# Patient Record
Sex: Female | Born: 1980 | Race: White | Hispanic: No | Marital: Single | Smoking: Never smoker
Health system: Southern US, Community
[De-identification: ages and names within clinical notes are randomized; demographics above are authoritative.]

## PROBLEM LIST (undated history)

## (undated) DIAGNOSIS — E282 Polycystic ovarian syndrome: Secondary | ICD-10-CM

## (undated) DIAGNOSIS — I10 Essential (primary) hypertension: Secondary | ICD-10-CM

## (undated) HISTORY — PX: SKIN CANCER EXCISION: SHX779

## (undated) HISTORY — DX: Polycystic ovarian syndrome: E28.2

## (undated) HISTORY — DX: Essential (primary) hypertension: I10

---

## 2012-07-07 LAB — HM PAP SMEAR: HM PAP: NEGATIVE

## 2012-11-02 ENCOUNTER — Ambulatory Visit: Payer: Self-pay | Admitting: Family Medicine

## 2013-02-08 ENCOUNTER — Ambulatory Visit: Payer: Self-pay | Admitting: Gastroenterology

## 2014-07-06 DIAGNOSIS — E282 Polycystic ovarian syndrome: Secondary | ICD-10-CM | POA: Insufficient documentation

## 2015-06-09 ENCOUNTER — Encounter: Payer: Self-pay | Admitting: Family Medicine

## 2015-06-09 ENCOUNTER — Ambulatory Visit (INDEPENDENT_AMBULATORY_CARE_PROVIDER_SITE_OTHER): Payer: BC Managed Care – PPO | Admitting: Family Medicine

## 2015-06-09 VITALS — BP 104/84 | HR 66 | Temp 97.9°F | Resp 16 | Ht 66.0 in | Wt 217.2 lb

## 2015-06-09 DIAGNOSIS — L709 Acne, unspecified: Secondary | ICD-10-CM

## 2015-06-09 DIAGNOSIS — K7581 Nonalcoholic steatohepatitis (NASH): Secondary | ICD-10-CM | POA: Insufficient documentation

## 2015-06-09 DIAGNOSIS — J0120 Acute ethmoidal sinusitis, unspecified: Secondary | ICD-10-CM | POA: Diagnosis not present

## 2015-06-09 MED ORDER — AMOXICILLIN-POT CLAVULANATE 875-125 MG PO TABS
1.0000 | ORAL_TABLET | Freq: Two times a day (BID) | ORAL | Status: DC
Start: 2015-06-09 — End: 2016-04-23

## 2015-06-09 NOTE — Patient Instructions (Signed)
Continue decongestants.

## 2015-06-09 NOTE — Progress Notes (Signed)
Subjective:     Patient ID: Elizabeth Hurst, female   DOB: [birth year], 34 y.o.   MRN: [id number]  HPI  Chief Complaint  Patient presents with  . Sinusitis    Patient comes in office today with concerns of sinus pain/pressure and congestion for the past 10 days. Patient reports taking otc Sudafed, Allegra and Nightquil for relief.   Reports purulent congestion.   Review of Systems  Constitutional: Negative for fever and chills.       Objective:   Physical Exam  Constitutional: She appears well-developed and well-nourished. No distress.  Ears: T.M's intact without inflammation Sinuses: + paranasal sinus tenderness Throat: no tonsillar enlargement or exudate Neck: no cervical adenopathy Lungs: clear     Assessment:    1. Acute ethmoidal sinusitis, recurrence not specified - amoxicillin-clavulanate (AUGMENTIN) 875-125 MG tablet; Take 1 tablet by mouth 2 (two) times daily.  Dispense: 20 tablet; Refill: 0    Plan:    Continue decongestants. ENT referral if recurrent.

## 2016-04-23 ENCOUNTER — Ambulatory Visit (INDEPENDENT_AMBULATORY_CARE_PROVIDER_SITE_OTHER): Payer: BC Managed Care – PPO | Admitting: Family Medicine

## 2016-04-23 ENCOUNTER — Ambulatory Visit
Admission: RE | Admit: 2016-04-23 | Discharge: 2016-04-23 | Disposition: A | Discharge status: Home / Self Care | Payer: BC Managed Care – PPO | Source: Ambulatory Visit | Attending: Family Medicine | Admitting: Family Medicine

## 2016-04-23 ENCOUNTER — Telehealth: Payer: Self-pay

## 2016-04-23 ENCOUNTER — Encounter: Payer: Self-pay | Admitting: Family Medicine

## 2016-04-23 VITALS — BP 124/94 | HR 72 | Temp 97.9°F | Resp 16 | Wt 233.6 lb

## 2016-04-23 DIAGNOSIS — X58XXXA Exposure to other specified factors, initial encounter: Secondary | ICD-10-CM | POA: Insufficient documentation

## 2016-04-23 DIAGNOSIS — M79642 Pain in left hand: Secondary | ICD-10-CM | POA: Diagnosis present

## 2016-04-23 DIAGNOSIS — S6992XA Unspecified injury of left wrist, hand and finger(s), initial encounter: Secondary | ICD-10-CM

## 2016-04-23 DIAGNOSIS — M7989 Other specified soft tissue disorders: Secondary | ICD-10-CM | POA: Insufficient documentation

## 2016-04-23 NOTE — Telephone Encounter (Signed)
-----   Message from Carmon Ginsberg, [place] sent at 04/23/2016  9:25 AM EDT ----- No fracture. Continue treatment we discussed in the office.

## 2016-04-23 NOTE — Patient Instructions (Signed)
Continue use of ibuprofen two pills 3-4 x day. Continue icing for 20 minutes three to four times a day. We will call you with the x-ray results.

## 2016-04-23 NOTE — Telephone Encounter (Signed)
Patient has been advised. KW 

## 2016-04-23 NOTE — Progress Notes (Signed)
Subjective:     Patient ID: Elizabeth Hurst, female   DOB: [birth year], 35 y.o.   MRN: [id number]  HPI  Chief Complaint  Patient presents with  . Hand Pain    Patient comes in office today with concerns of pain to her left hand. Yesterday 9/25 patient states she was walking out bathroom when she swung her hand and hit bathroom counter. Patient reports pain when grasping objects or simple tasks such as washing her hands. Patient states that she took Advil last night for pain relief.   Localizes pain and swelling to her left second metacarpal.   Review of Systems     Objective:   Physical Exam  Constitutional: She appears well-developed and well-nourished. No distress.  Musculoskeletal:  Dorsum of left hand with mild swelling and moderate tenderness over her left mid-shaft metacarpal. Can flex/extend her second finger and wrist without pain. Capillary refill intact distally       Assessment:    1. Hand injury, left, initial encounter - DG Hand Complete Left; Future    Plan:    ACE wrap applied. Continue icing and ibuprofen pending x-ray report.

## 2016-08-31 ENCOUNTER — Ambulatory Visit (INDEPENDENT_AMBULATORY_CARE_PROVIDER_SITE_OTHER): Payer: BC Managed Care – PPO | Admitting: Family Medicine

## 2016-08-31 ENCOUNTER — Encounter: Payer: Self-pay | Admitting: Family Medicine

## 2016-08-31 VITALS — BP 128/76 | HR 82 | Temp 98.2°F | Resp 12 | Wt 232.0 lb

## 2016-08-31 DIAGNOSIS — J0100 Acute maxillary sinusitis, unspecified: Secondary | ICD-10-CM | POA: Diagnosis not present

## 2016-08-31 MED ORDER — AMOXICILLIN 875 MG PO TABS: 875.0000 mg | ORAL_TABLET | Freq: Two times a day (BID) | ORAL | 0 refills | Status: DC

## 2016-08-31 NOTE — Progress Notes (Signed)
Patient: Elizabeth Hurst Female    DOB: March 30, 1981   36 y.o.   MRN: [id number] Visit Date: 08/31/2016  Today's Provider: Vernie Murders, PA   Chief Complaint  Patient presents with  . Sinusitis   Subjective:    HPI  Patient states she has had drainage and stuffy nose with PND for the past 2 weeks. For about 1 week now she has had sinus pressure pain around her eyes headache and teeth ache now. She has been taking Allegra daily, decongestant, Flonase last night and Advil. Denies fever, chills, cough, body aches.   Patient Active Problem List   Diagnosis Date Noted  . NASH (nonalcoholic steatohepatitis) 06/09/2015  . Acne 06/09/2015  . Bilateral polycystic ovarian syndrome 07/06/2014   No past surgical history on file. No family history on file.  Allergies  Allergen Reactions  . Tetanus Toxoids Rash and Anaphylaxis    patient states she has a reaction to the tetnaus shot and that she was told before to take benadryl prior to recieving vaccine     Current Outpatient Prescriptions:  Marland Kitchen  Dermatological Products, Misc. (HYLATOPIC PLUS) CREA, , Disp: , Rfl:  .  doxycycline (VIBRAMYCIN) 100 MG capsule, , Disp: , Rfl:  .  drospirenone-ethinyl estradiol (OCELLA) 3-0.03 MG tablet, Take by mouth., Disp: , Rfl:  .  metFORMIN (GLUCOPHAGE-XR) 500 MG 24 hr tablet, TAKE 2 TABLETS (1,000 MG TOTAL) BY MOUTH 2 (TWO) TIMES DAILY, Disp: , Rfl: 3 .  spironolactone (ALDACTONE) 100 MG tablet, Take by mouth., Disp: , Rfl:  .  FLUCELVAX QUADRIVALENT 0.5 ML SUSY, , Disp: , Rfl:   Review of Systems  Constitutional: Negative.   HENT: Positive for congestion, postnasal drip, sinus pain and sinus pressure.   Respiratory: Negative.   Cardiovascular: Negative.   Neurological: Positive for headaches.    Social History  Substance Use Topics  . Smoking status: Never Smoker  . Smokeless tobacco: Never Used  . Alcohol use Not on file   Objective:   BP 128/76   Pulse 82   Temp 98.2 F (36.8  C)   Resp 12   Wt 232 lb (105.2 kg)   SpO2 98%   BMI 37.45 kg/m   Physical Exam  Constitutional: She is oriented to person, place, and time. She appears well-developed and well-nourished. No distress.  HENT:  Head: Normocephalic and atraumatic.  Right Ear: Hearing and external ear normal.  Left Ear: Hearing and external ear normal.  Nose: Nose normal.  Mouth/Throat: Oropharynx is clear and moist.  Tender maxillary sinuses with cloudy transillumination.  Eyes: Conjunctivae and lids are normal. Right eye exhibits no discharge. Left eye exhibits no discharge. No scleral icterus.  Neck: Neck supple.  Cardiovascular: Normal rate and regular rhythm.   Pulmonary/Chest: Effort normal and breath sounds normal. No respiratory distress.  Musculoskeletal: Normal range of motion.  Neurological: She is alert and oriented to person, place, and time.  Skin: Skin is intact. No lesion and no rash noted.  Psychiatric: She has a normal mood and affect. Her speech is normal and behavior is normal. Thought content normal.      Assessment & Plan:     1. Acute maxillary sinusitis, recurrence not specified Nasal congestion and PND with sinus pressure pain over the past 10-14 days. No fever or cough. May continue antihistamine and Flonase. Add antibiotic and use expectorant if needed. Recheck if no better in 1 weeks. - amoxicillin (AMOXIL) 875 MG tablet; Take 1  tablet (875 mg total) by mouth 2 (two) times daily.  Dispense: 20 tablet; Refill: [place], [place]  [facility]

## 2016-08-31 NOTE — Patient Instructions (Signed)

## 2016-12-07 ENCOUNTER — Ambulatory Visit (INDEPENDENT_AMBULATORY_CARE_PROVIDER_SITE_OTHER): Payer: BC Managed Care – PPO | Admitting: Physician Assistant

## 2016-12-07 ENCOUNTER — Encounter: Payer: Self-pay | Admitting: Physician Assistant

## 2016-12-07 DIAGNOSIS — J0100 Acute maxillary sinusitis, unspecified: Secondary | ICD-10-CM | POA: Diagnosis not present

## 2016-12-07 MED ORDER — AMOXICILLIN 875 MG PO TABS: 875.0000 mg | ORAL_TABLET | Freq: Two times a day (BID) | ORAL | 0 refills | Status: DC

## 2016-12-07 NOTE — Patient Instructions (Signed)

## 2016-12-07 NOTE — Progress Notes (Signed)
Patient: Elizabeth Hurst Female    DOB: Sep 13, 1980   36 y.o.   MRN: [id number] Visit Date: 12/07/2016  Today's Provider: Mar Daring, PA-C   Chief Complaint  Patient presents with  . URI   Subjective:    URI   This is a new problem. The current episode started in the past 7 days (about 5 days). The problem has been gradually worsening. There has been no fever. Associated symptoms include congestion, coughing, headaches, a plugged ear sensation, rhinorrhea, sinus pain and sneezing. Pertinent negatives include no abdominal pain, chest pain, ear pain, nausea, sore throat or wheezing. She has tried antihistamine and decongestant for the symptoms. The treatment provided mild relief.      Allergies  Allergen Reactions  . Tetanus Toxoids Rash and Anaphylaxis    patient states she has a reaction to the tetnaus shot and that she was told before to take benadryl prior to recieving vaccine     Current Outpatient Prescriptions:  .  amoxicillin (AMOXIL) 875 MG tablet, Take 1 tablet (875 mg total) by mouth 2 (two) times daily., Disp: 20 tablet, Rfl: 0 .  Dermatological Products, Misc. (HYLATOPIC PLUS) CREA, , Disp: , Rfl:  .  doxycycline (VIBRAMYCIN) 100 MG capsule, , Disp: , Rfl:  .  drospirenone-ethinyl estradiol (OCELLA) 3-0.03 MG tablet, Take by mouth., Disp: , Rfl:  .  fexofenadine (ALLEGRA) 180 MG tablet, Take 180 mg by mouth daily., Disp: , Rfl:  .  FLUCELVAX QUADRIVALENT 0.5 ML SUSY, , Disp: , Rfl:  .  metFORMIN (GLUCOPHAGE-XR) 500 MG 24 hr tablet, TAKE 2 TABLETS (1,000 MG TOTAL) BY MOUTH 2 (TWO) TIMES DAILY, Disp: , Rfl: 3 .  spironolactone (ALDACTONE) 100 MG tablet, Take by mouth., Disp: , Rfl:   Review of Systems  Constitutional: Positive for fatigue. Negative for fever.  HENT: Positive for congestion, postnasal drip, rhinorrhea, sinus pain and sneezing. Negative for ear pain, sore throat and trouble swallowing.   Respiratory: Positive for cough. Negative for chest  tightness, shortness of breath and wheezing.   Cardiovascular: Negative for chest pain, palpitations and leg swelling.  Gastrointestinal: Negative for abdominal pain and nausea.  Neurological: Positive for headaches. Negative for dizziness.    Social History  Substance Use Topics  . Smoking status: Never Smoker  . Smokeless tobacco: Never Used  . Alcohol use Not on file   Objective:   BP 118/74 (BP Location: Right Arm, Patient Position: Sitting, Cuff Size: Normal)   Pulse 88   Temp 97.9 F (36.6 C)   Resp 16   Wt 233 lb (105.7 kg)   SpO2 99%   BMI 37.61 kg/m  Vitals:   12/07/16 0944  BP: 118/74  Pulse: 88  Resp: 16  Temp: 97.9 F (36.6 C)  SpO2: 99%  Weight: 233 lb (105.7 kg)     Physical Exam  Constitutional: She appears well-developed and well-nourished. No distress.  HENT:  Head: Normocephalic and atraumatic.  Right Ear: Hearing, tympanic membrane, external ear and ear canal normal.  Left Ear: Hearing, tympanic membrane, external ear and ear canal normal.  Nose: Right sinus exhibits maxillary sinus tenderness. Right sinus exhibits no frontal sinus tenderness. Left sinus exhibits maxillary sinus tenderness. Left sinus exhibits no frontal sinus tenderness.  Mouth/Throat: Uvula is midline, oropharynx is clear and moist and mucous membranes are normal. No oropharyngeal exudate.  Neck: Normal range of motion. Neck supple. No tracheal deviation present. No thyromegaly present.  Cardiovascular: Normal rate,  regular rhythm and normal heart sounds.  Exam reveals no gallop and no friction rub.   No murmur heard. Pulmonary/Chest: Effort normal and breath sounds normal. No stridor. No respiratory distress. She has no wheezes. She has no rales.  Lymphadenopathy:    She has no cervical adenopathy.  Skin: She is not diaphoretic.  Vitals reviewed.      Assessment & Plan:     1. Acute maxillary sinusitis, recurrence not specified Worsening symptoms that have not responded to  OTC medications. Will give amoxicillin as below. Continue allergy medications. Stay well hydrated and get plenty of rest. Call if no symptom improvement or if symptoms worsen. - amoxicillin (AMOXIL) 875 MG tablet; Take 1 tablet (875 mg total) by mouth 2 (two) times daily.  Dispense: 20 tablet; Refill: 0       Mar Daring, PA-C  [facility]

## 2017-04-11 ENCOUNTER — Other Ambulatory Visit: Payer: Self-pay | Admitting: Obstetrics and Gynecology

## 2017-06-09 ENCOUNTER — Other Ambulatory Visit: Payer: Self-pay

## 2017-06-09 MED ORDER — DROSPIRENONE-ETHINYL ESTRADIOL 3-0.03 MG PO TABS: 1.0000 | ORAL_TABLET | Freq: Every day | ORAL | 1 refills | Status: DC

## 2017-06-09 NOTE — Telephone Encounter (Signed)
Pt aware bc refill eRx'd. 

## 2017-06-11 ENCOUNTER — Telehealth: Payer: Self-pay | Admitting: Obstetrics and Gynecology

## 2017-06-11 ENCOUNTER — Other Ambulatory Visit: Payer: Self-pay

## 2017-06-11 MED ORDER — DROSPIRENONE-ETHINYL ESTRADIOL 3-0.03 MG PO TABS: 1.0000 | ORAL_TABLET | Freq: Every day | ORAL | 0 refills | Status: DC

## 2017-06-11 NOTE — Telephone Encounter (Signed)
1 refill sent to pharm

## 2017-06-11 NOTE — Telephone Encounter (Signed)
Pt needs bc refilled asap. Annual has be scheduled.

## 2017-07-30 ENCOUNTER — Encounter: Payer: Self-pay | Admitting: Obstetrics and Gynecology

## 2017-07-30 ENCOUNTER — Ambulatory Visit (INDEPENDENT_AMBULATORY_CARE_PROVIDER_SITE_OTHER): Payer: BC Managed Care – PPO | Admitting: Obstetrics and Gynecology

## 2017-07-30 VITALS — BP 130/90 | HR 88 | Ht 66.0 in | Wt 236.0 lb

## 2017-07-30 DIAGNOSIS — Z01419 Encounter for gynecological examination (general) (routine) without abnormal findings: Secondary | ICD-10-CM | POA: Diagnosis not present

## 2017-07-30 DIAGNOSIS — Z3041 Encounter for surveillance of contraceptive pills: Secondary | ICD-10-CM

## 2017-07-30 DIAGNOSIS — E282 Polycystic ovarian syndrome: Secondary | ICD-10-CM

## 2017-07-30 MED ORDER — DROSPIRENONE-ETHINYL ESTRADIOL 3-0.03 MG PO TABS: 1.0000 | ORAL_TABLET | Freq: Every day | ORAL | 4 refills | Status: DC

## 2017-07-30 NOTE — Patient Instructions (Signed)
I value your feedback and entrusting us with your care. If you get a [facility] patient survey, I would appreciate you taking the time to let us know about your experience today. Thank you! 

## 2017-07-30 NOTE — Progress Notes (Signed)
PCP:  Carmon Ginsberg, PA   Chief Complaint  Patient presents with  . Gynecologic Exam     HPI:      [name] is a 37 y.o. No obstetric history on file. who LMP was Patient's last menstrual period was 07/07/2017., presents today for her annual examination.  Her menses are Q3 months with cont dosing OCPs, lasting 5 days.  Dysmenorrhea mild, occurring first 1-2 days of flow. She does not have intermenstrual bleeding. She is on OCPs for PCOS.   Sex activity: not sexually active.  Last Pap: February 28, 2015  Results were: no abnormalities /neg HPV DNA  Hx of STDs: none  There is no FH of breast cancer. There is no FH of ovarian cancer. The patient does not do self-breast exams.  Tobacco use: The patient denies current or previous tobacco use. Alcohol use: none No drug use.  Exercise: not active  She does get adequate calcium and Vitamin D in her diet.   Past Medical History:  Diagnosis Date  . Polycystic ovaries     History reviewed. No pertinent surgical history.  Family History  Problem Relation Age of Onset  . Hyperlipidemia Mother   . Hyperlipidemia Father   . Hyperlipidemia Maternal Grandmother   . Hyperlipidemia Maternal Grandfather   . Hyperlipidemia Paternal Grandmother   . Lymphoma Paternal Grandmother   . Hyperlipidemia Paternal Grandfather   . Lung cancer Paternal Grandfather   . Cervical cancer Maternal Aunt   . Stomach cancer Maternal Aunt        great aunt  . Ovarian cancer Cousin        3rd cousin  . Cancer Cousin        3rd cousin    Social History   Socioeconomic History  . Marital status: Single    Spouse name: Not on file  . Number of children: Not on file  . Years of education: Not on file  . Highest education level: Not on file  Social Needs  . Financial resource strain: Not on file  . Food insecurity - worry: Not on file  . Food insecurity - inability: Not on file  . Transportation needs - medical: Not on file  .  Transportation needs - non-medical: Not on file  Occupational History  . Not on file  Tobacco Use  . Smoking status: Never Smoker  . Smokeless tobacco: Never Used  Substance and Sexual Activity  . Alcohol use: Yes    Alcohol/week: 0.0 oz  . Drug use: No  . Sexual activity: No    Birth control/protection: Pill  Other Topics Concern  . Not on file  Social History Narrative  . Not on file    Current Meds  Medication Sig  . doxycycline (VIBRAMYCIN) 100 MG capsule   . drospirenone-ethinyl estradiol (OCELLA) 3-0.03 MG tablet Take 1 tablet by mouth daily. CONTINUOUS DOSING  . fexofenadine (ALLEGRA) 180 MG tablet Take 180 mg by mouth daily.  . metFORMIN (GLUCOPHAGE-XR) 500 MG 24 hr tablet TAKE 2 TABLETS (1,000 MG TOTAL) BY MOUTH 2 (TWO) TIMES DAILY  . spironolactone (ALDACTONE) 100 MG tablet Take by mouth.  . [DISCONTINUED] drospirenone-ethinyl estradiol (OCELLA) 3-0.03 MG tablet Take 1 tablet daily by mouth.     ROS:  Review of Systems  Constitutional: Negative for fatigue, fever and unexpected weight change.  Respiratory: Negative for cough, shortness of breath and wheezing.   Cardiovascular: Negative for chest pain, palpitations and leg swelling.  Gastrointestinal: Negative for  blood in stool, constipation, diarrhea, nausea and vomiting.  Endocrine: Negative for cold intolerance, heat intolerance and polyuria.  Genitourinary: Negative for dyspareunia, dysuria, flank pain, frequency, genital sores, hematuria, menstrual problem, pelvic pain, urgency, vaginal bleeding, vaginal discharge and vaginal pain.  Musculoskeletal: Negative for back pain, joint swelling and myalgias.  Skin: Negative for rash.  Neurological: Negative for dizziness, syncope, light-headedness, numbness and headaches.  Hematological: Negative for adenopathy.  Psychiatric/Behavioral: Negative for agitation, confusion, sleep disturbance and suicidal ideas. The patient is not nervous/anxious.      Objective: BP  130/90   Pulse 88   Ht 5\' 6"  (1.676 m)   Wt 236 lb (107 kg)   LMP 07/07/2017   BMI 38.09 kg/m    Physical Exam  Constitutional: She is oriented to person, place, and time. She appears well-developed and well-nourished.  Genitourinary: Vagina normal and uterus normal. There is no rash or tenderness on the right labia. There is no rash or tenderness on the left labia. No erythema or tenderness in the vagina. No vaginal discharge found. Right adnexum does not display mass and does not display tenderness. Left adnexum does not display mass and does not display tenderness. Cervix does not exhibit motion tenderness or polyp. Uterus is not enlarged or tender.  Neck: Normal range of motion. No thyromegaly present.  Cardiovascular: Normal rate, regular rhythm and normal heart sounds.  No murmur heard. Pulmonary/Chest: Effort normal and breath sounds normal. Right breast exhibits no mass, no nipple discharge, no skin change and no tenderness. Left breast exhibits no mass, no nipple discharge, no skin change and no tenderness.  Abdominal: Soft. There is no tenderness. There is no guarding.  Musculoskeletal: Normal range of motion.  Neurological: She is alert and oriented to person, place, and time. No cranial nerve deficit.  Psychiatric: She has a normal mood and affect. Her behavior is normal.  Vitals reviewed.   Assessment/Plan: Encounter for annual routine gynecological examination  Encounter for surveillance of contraceptive pills - OCP RF - Plan: drospirenone-ethinyl estradiol (OCELLA) 3-0.03 MG tablet  PCOS (polycystic ovarian syndrome) - Controlled with OCPs. - Plan: drospirenone-ethinyl estradiol (OCELLA) 3-0.03 MG tablet  Meds ordered this encounter  Medications  . drospirenone-ethinyl estradiol (OCELLA) 3-0.03 MG tablet    Sig: Take 1 tablet by mouth daily. CONTINUOUS DOSING    Dispense:  3 Package    Refill:  4             GYN counsel adequate intake of calcium and vitamin D,  diet and exercise     F/U  Return in about 1 year (around 07/30/2018).  Damian Hofstra B. Kaytlin Burklow, PA-C 07/30/2017 11:35 AM

## 2017-08-20 ENCOUNTER — Encounter: Payer: Self-pay | Admitting: Family Medicine

## 2017-08-20 ENCOUNTER — Ambulatory Visit: Payer: BC Managed Care – PPO | Admitting: Family Medicine

## 2017-08-20 VITALS — BP 130/90 | HR 107 | Temp 98.2°F | Resp 16 | Wt 233.0 lb

## 2017-08-20 DIAGNOSIS — H6983 Other specified disorders of Eustachian tube, bilateral: Secondary | ICD-10-CM | POA: Diagnosis not present

## 2017-08-20 DIAGNOSIS — R030 Elevated blood-pressure reading, without diagnosis of hypertension: Secondary | ICD-10-CM

## 2017-08-20 DIAGNOSIS — J069 Acute upper respiratory infection, unspecified: Secondary | ICD-10-CM | POA: Diagnosis not present

## 2017-08-20 NOTE — Patient Instructions (Signed)
Eustachian Tube Dysfunction The eustachian tube connects the middle ear to the back of the nose. It regulates air pressure in the middle ear by allowing air to move between the ear and nose. It also helps to drain fluid from the middle ear space. When the eustachian tube does not function properly, air pressure, fluid, or both can build up in the middle ear. Eustachian tube dysfunction can affect one or both ears. What are the causes? This condition happens when the eustachian tube becomes blocked or cannot open normally. This may result from:  Ear infections.  Colds and other upper respiratory infections.  Allergies.  Irritation, such as from cigarette smoke or acid from the stomach coming up into the esophagus (gastroesophageal reflux).  Sudden changes in air pressure, such as from descending in an airplane.  Abnormal growths in the nose or throat, such as nasal polyps, tumors, or enlarged tissue at the back of the throat (adenoids).  What increases the risk? This condition may be more likely to develop in people who smoke and people who are overweight. Eustachian tube dysfunction may also be more likely to develop in children, especially children who have:  Certain birth defects of the mouth, such as cleft palate.  Large tonsils and adenoids.  What are the signs or symptoms? Symptoms of this condition may include:  A feeling of fullness in the ear.  Ear pain.  Clicking or popping noises in the ear.  Ringing in the ear.  Hearing loss.  Loss of balance.  Symptoms may get worse when the air pressure around you changes, such as when you travel to an area of high elevation or fly on an airplane. How is this diagnosed? This condition may be diagnosed based on:  Your symptoms.  A physical exam of your ear, nose, and throat.  Tests, such as those that measure: ? The movement of your eardrum (tympanogram). ? Your hearing (audiometry).  How is this treated? Treatment  depends on the cause and severity of your condition. If your symptoms are mild, you may be able to relieve your symptoms by moving air into ("popping") your ears. If you have symptoms of fluid in your ears, treatment may include:  Decongestants.  Antihistamines.  Nasal sprays or ear drops that contain medicines that reduce swelling (steroids).  In some cases, you may need to have a procedure to drain the fluid in your eardrum (myringotomy). In this procedure, a small tube is placed in the eardrum to:  Drain the fluid.  Restore the air in the middle ear space.  Follow these instructions at home:  Take over-the-counter and prescription medicines only as told by your health care provider.  Use techniques to help pop your ears as recommended by your health care provider. These may include: ? Chewing gum. ? Yawning. ? Frequent, forceful swallowing. ? Closing your mouth, holding your nose closed, and gently blowing as if you are trying to blow air out of your nose.  Do not do any of the following until your health care provider approves: ? Travel to high altitudes. ? Fly in airplanes. ? Work in a pressurized cabin or room. ? Scuba dive.  Keep your ears dry. Dry your ears completely after showering or bathing.  Do not smoke.  Keep all follow-up visits as told by your health care provider. This is important. Contact a health care provider if:  Your symptoms do not go away after treatment.  Your symptoms come back after treatment.  You are   unable to pop your ears.  You have: ? A fever. ? Pain in your ear. ? Pain in your head or neck. ? Fluid draining from your ear.  Your hearing suddenly changes.  You become very dizzy.  You lose your balance. This information is not intended to replace advice given to you by your health care provider. Make sure you discuss any questions you have with your health care provider. Document Released: 08/11/2015 Document Revised: 12/21/2015  Document Reviewed: 08/03/2014 Elsevier Interactive Patient Education  2018 [employer]  

## 2017-08-20 NOTE — Progress Notes (Signed)
Patient: Elizabeth Hurst Female    DOB: [birth year]   37 y.o.   MRN: [id number] Visit Date: 08/20/2017  Today's Provider: [name], MD   I, Martha Clan, CMA, am acting as scribe for [name], MD.  Chief Complaint  Patient presents with  . URI   Subjective:    URI   This is a new problem. Episode onset: x 6 days. The problem has been unchanged. Maximum temperature: highest documented temperature was 99 "point something" Associated symptoms include congestion, coughing (productive of yellow sputum), diarrhea (at onset), headaches, a plugged ear sensation, rhinorrhea, sneezing and swollen glands (at onset). Pertinent negatives include no abdominal pain, chest pain, dysuria, ear pain, nausea, neck pain, sinus pain, sore throat, vomiting or wheezing. Treatments tried: NyQuil, DayQuil, Mucinex. The treatment provided mild relief.   Elevated BP:  - has never been diagnosed with HTN - states it has never been elevated this high in the past - takes no medications for HTN - denies vision changes, chest pain, or shortness of breath    Allergies  Allergen Reactions  . Tetanus Toxoids Rash and Anaphylaxis    patient states she has a reaction to the tetnaus shot and that she was told before to take benadryl prior to recieving vaccine     Current Outpatient Medications:  Marland Kitchen  Dermatological Products, Misc. (HYLATOPIC PLUS) CREA, , Disp: , Rfl:  .  doxycycline (VIBRAMYCIN) 100 MG capsule, , Disp: , Rfl:  .  drospirenone-ethinyl estradiol (OCELLA) 3-0.03 MG tablet, Take 1 tablet by mouth daily. CONTINUOUS DOSING, Disp: 3 Package, Rfl: 4 .  fexofenadine (ALLEGRA) 180 MG tablet, Take 180 mg by mouth daily., Disp: , Rfl:  .  metFORMIN (GLUCOPHAGE-XR) 500 MG 24 hr tablet, TAKE 2 TABLETS (1,000 MG TOTAL) BY MOUTH 2 (TWO) TIMES DAILY, Disp: , Rfl: 3 .  spironolactone (ALDACTONE) 100 MG tablet, Take by mouth., Disp: , Rfl:   Review of Systems  HENT: Positive for  congestion, rhinorrhea and sneezing. Negative for ear pain, sinus pain and sore throat.   Respiratory: Positive for cough (productive of yellow sputum). Negative for wheezing.   Cardiovascular: Negative for chest pain.  Gastrointestinal: Positive for diarrhea (at onset). Negative for abdominal pain, nausea and vomiting.  Genitourinary: Negative for dysuria.  Musculoskeletal: Negative for neck pain.  Neurological: Positive for headaches.    Social History   Tobacco Use  . Smoking status: Never Smoker  . Smokeless tobacco: Never Used  Substance Use Topics  . Alcohol use: Yes    Alcohol/week: 0.0 oz   Objective:   BP 130/90   Pulse (!) 107   Temp 98.2 F (36.8 C) (Oral)   Resp 16   Wt 233 lb (105.7 kg)   LMP 07/20/2017 Comment: continuous dosing  SpO2 99%   BMI 37.61 kg/m  Vitals:   08/20/17 1604 08/20/17 1628  BP: (!) 164/122 130/90  Pulse: (!) 107   Resp: 16   Temp: 98.2 F (36.8 C)   TempSrc: Oral   SpO2: 99%   Weight: 233 lb (105.7 kg)      Physical Exam  Constitutional: She is oriented to person, place, and time. She appears well-developed and well-nourished. No distress.  HENT:  Head: Normocephalic and atraumatic.  Right Ear: External ear and ear canal normal. Tympanic membrane is bulging. Tympanic membrane is not erythematous.  Left Ear: External ear and ear canal normal. Tympanic membrane is bulging. Tympanic membrane is not erythematous.  Nose:  Mucosal edema present. Right sinus exhibits no maxillary sinus tenderness and no frontal sinus tenderness. Left sinus exhibits no maxillary sinus tenderness and no frontal sinus tenderness.  Mouth/Throat: Uvula is midline and mucous membranes are normal. Posterior oropharyngeal erythema present. No oropharyngeal exudate or posterior oropharyngeal edema.  Eyes: Conjunctivae are normal. Pupils are equal, round, and reactive to light. No scleral icterus.  Neck: Neck supple.  Cardiovascular: Normal rate, regular rhythm,  normal heart sounds and intact distal pulses.  No murmur heard. Pulmonary/Chest: Effort normal and breath sounds normal. No respiratory distress. She has no wheezes. She has no rales.  Musculoskeletal: She exhibits no edema or deformity.  Lymphadenopathy:    She has no cervical adenopathy.  Neurological: She is alert and oriented to person, place, and time.  Psychiatric: She has a normal mood and affect. Her behavior is normal.  Vitals reviewed.       Assessment & Plan:     1. Viral URI - no evidence of AOM, CAP, sinusitis - discussed natural course, symptomatic management, and return precautions  2. Dysfunction of both eustachian tubes - discussed flonase use regularly to help with de-congestion and eustachian tube dysfunction  3. Elevated blood pressure reading - better on manual recheck, but still elevated - likely related to acute illness - no red flags - discussed return precautions - f/u in 1 month when well to reeval BP   Return in about 4 weeks (around 09/17/2017) for blood pressure f/u.   The entirety of the information documented in the History of Present Illness, Review of Systems and Physical Exam were personally obtained by me. Portions of this information were initially documented by Raquel Sarna Ratchford, CMA and reviewed by me for thoroughness and accuracy.    Virginia Crews, MD, MPH [facility] 08/20/2017 4:32 PM

## 2017-12-16 ENCOUNTER — Ambulatory Visit: Payer: BC Managed Care – PPO | Admitting: Family Medicine

## 2017-12-16 ENCOUNTER — Encounter: Payer: Self-pay | Admitting: Family Medicine

## 2017-12-16 ENCOUNTER — Telehealth: Payer: Self-pay | Admitting: Family Medicine

## 2017-12-16 ENCOUNTER — Other Ambulatory Visit: Payer: Self-pay | Admitting: Family Medicine

## 2017-12-16 DIAGNOSIS — J302 Other seasonal allergic rhinitis: Secondary | ICD-10-CM | POA: Insufficient documentation

## 2017-12-16 DIAGNOSIS — J301 Allergic rhinitis due to pollen: Secondary | ICD-10-CM | POA: Diagnosis not present

## 2017-12-16 MED ORDER — AMOXICILLIN-POT CLAVULANATE 875-125 MG PO TABS: 1.0000 | ORAL_TABLET | Freq: Two times a day (BID) | ORAL | 0 refills | Status: DC

## 2017-12-16 MED ORDER — PREDNISONE 10 MG PO TABS: ORAL_TABLET | ORAL | 0 refills | Status: DC

## 2017-12-16 NOTE — Telephone Encounter (Signed)
Per Mikki Santee we can add patient to his 3:40 pm appt today 12/16/17 if patient calls back. LMOM for patient. If not able to come she can try Urgent Care or another available appt.

## 2017-12-16 NOTE — Patient Instructions (Signed)
Add a decongestant and continue Flonase. If not improving over the next 24 hours start Prednisone. If you see yellow or green drainage start the antibiotic.

## 2017-12-16 NOTE — Telephone Encounter (Signed)
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 She will call back to schedule appt.

## 2017-12-16 NOTE — Telephone Encounter (Signed)
Pt called wanting to make an appt today with Mikki Santee or anyone for sinus symptoms, congestion and pain in that area,  Questioning a sinus infection.  There are no appts today and I offered her tomorrow am at 8 am but she cant make that.  Please advise  [phone]  Con Memos

## 2017-12-16 NOTE — Progress Notes (Signed)
  Subjective:     Patient ID: Elizabeth Hurst, female   DOB: Dec 27, 1980, 37 y.o.   MRN: [id number] Chief Complaint  Patient presents with  . Sinus Problem   HPI Reports her allergies were flaring last week and resumed Flonase. Over the last 3 days reports increased sinus pressure,  Headache, and tooth sensitivity without sinus drainage.  Review of Systems     Objective:   Physical Exam  Constitutional: She appears well-developed and well-nourished. No distress.  Ears: T.M's intact without inflammation Sinuses: mild maxillary and paranasal sinus tenderness Throat: no tonsillar enlargement or exudate Neck: no cervical adenopathy Lungs: clear     Assessment:    1. Seasonal allergic rhinitis due to pollen - predniSONE (DELTASONE) 10 MG tablet; Taper daily as follows: 6 pills, 5, 4, 3, 2, 1  Dispense: 21 tablet; Refill: 0 - amoxicillin-clavulanate (AUGMENTIN) 875-125 MG tablet; Take 1 tablet by mouth 2 (two) times daily.  Dispense: 20 tablet; Refill: 0    Plan:    Continue Flonase and add decongestants. If sinuses not improving add prednisone. For purulent sinus drainage start the abx.

## 2018-08-17 ENCOUNTER — Other Ambulatory Visit: Payer: Self-pay | Admitting: Obstetrics and Gynecology

## 2018-08-17 DIAGNOSIS — E282 Polycystic ovarian syndrome: Secondary | ICD-10-CM

## 2018-08-17 DIAGNOSIS — Z3041 Encounter for surveillance of contraceptive pills: Secondary | ICD-10-CM

## 2018-09-04 ENCOUNTER — Other Ambulatory Visit: Payer: Self-pay

## 2018-09-04 DIAGNOSIS — Z3041 Encounter for surveillance of contraceptive pills: Secondary | ICD-10-CM

## 2018-09-04 DIAGNOSIS — E282 Polycystic ovarian syndrome: Secondary | ICD-10-CM

## 2018-09-04 MED ORDER — DROSPIRENONE-ETHINYL ESTRADIOL 3-0.03 MG PO TABS: 1.0000 | ORAL_TABLET | Freq: Every day | ORAL | 0 refills | Status: DC

## 2018-09-04 NOTE — Telephone Encounter (Signed)
Pt scheduled appt for 2/18 and needs bc refill until appt.  [phone]  Pt aware refill eRx'd.

## 2018-09-15 ENCOUNTER — Other Ambulatory Visit (HOSPITAL_COMMUNITY)
Admission: RE | Admit: 2018-09-15 | Discharge: 2018-09-15 | Disposition: A | Discharge status: Home / Self Care | Payer: BC Managed Care – PPO | Source: Ambulatory Visit | Attending: Obstetrics and Gynecology | Admitting: Obstetrics and Gynecology

## 2018-09-15 ENCOUNTER — Encounter: Payer: Self-pay | Admitting: Obstetrics and Gynecology

## 2018-09-15 ENCOUNTER — Ambulatory Visit (INDEPENDENT_AMBULATORY_CARE_PROVIDER_SITE_OTHER): Payer: BC Managed Care – PPO | Admitting: Obstetrics and Gynecology

## 2018-09-15 VITALS — BP 130/90 | HR 96 | Ht 66.0 in | Wt 238.0 lb

## 2018-09-15 DIAGNOSIS — Z1151 Encounter for screening for human papillomavirus (HPV): Secondary | ICD-10-CM | POA: Diagnosis present

## 2018-09-15 DIAGNOSIS — Z3041 Encounter for surveillance of contraceptive pills: Secondary | ICD-10-CM

## 2018-09-15 DIAGNOSIS — Z124 Encounter for screening for malignant neoplasm of cervix: Secondary | ICD-10-CM | POA: Diagnosis not present

## 2018-09-15 DIAGNOSIS — Z01419 Encounter for gynecological examination (general) (routine) without abnormal findings: Secondary | ICD-10-CM

## 2018-09-15 DIAGNOSIS — E282 Polycystic ovarian syndrome: Secondary | ICD-10-CM

## 2018-09-15 MED ORDER — DROSPIRENONE-ETHINYL ESTRADIOL 3-0.03 MG PO TABS: 1.0000 | ORAL_TABLET | Freq: Every day | ORAL | 3 refills | Status: DC

## 2018-09-15 NOTE — Progress Notes (Addendum)
PCP:  Carmon Ginsberg, PA   Chief Complaint  Patient presents with  . Gynecologic Exam     HPI:      [name] is a 38 y.o. No obstetric history on file. who LMP was Patient's last menstrual period was 08/31/2018 (approximate)., presents today for her annual examination.  Her menses are Q3 months with cont dosing OCPs, lasting 5-7 days.  Dysmenorrhea mild, occurring first 1-2 days of flow. She does not have intermenstrual bleeding. She is on OCPs for PCOS. Also followed by endocrine and takes metformin and spironolactone for PCOS.   Sex activity: not sexually active.  Last Pap: February 28, 2015  Results were: no abnormalities /neg HPV DNA  Hx of STDs: none  There is no FH of breast cancer. There is no FH of ovarian cancer. The patient does not do self-breast exams.  Tobacco use: The patient denies current or previous tobacco use. Alcohol use: none No drug use.  Exercise: not active  She does get adequate calcium but not Vitamin D in her diet. Labs with PCP  Past Medical History:  Diagnosis Date  . Polycystic ovaries     History reviewed. No pertinent surgical history.  Family History  Problem Relation Age of Onset  . Hyperlipidemia Mother   . Hyperlipidemia Father   . Hyperlipidemia Maternal Grandmother   . Hyperlipidemia Maternal Grandfather   . Hyperlipidemia Paternal Grandmother   . Lymphoma Paternal Grandmother   . Hyperlipidemia Paternal Grandfather   . Lung cancer Paternal Grandfather   . Cervical cancer Maternal Aunt   . Stomach cancer Maternal Aunt        great aunt  . Ovarian cancer Cousin        3rd cousin  . Cancer Cousin        3rd cousin    Social History   Socioeconomic History  . Marital status: Single    Spouse name: Not on file  . Number of children: Not on file  . Years of education: Not on file  . Highest education level: Not on file  Occupational History  . Not on file  Social Needs  . Financial resource strain: Not on  file  . Food insecurity:    Worry: Not on file    Inability: Not on file  . Transportation needs:    Medical: Not on file    Non-medical: Not on file  Tobacco Use  . Smoking status: Never Smoker  . Smokeless tobacco: Never Used  Substance and Sexual Activity  . Alcohol use: Yes    Alcohol/week: 0.0 standard drinks  . Drug use: No  . Sexual activity: Never    Birth control/protection: Pill  Lifestyle  . Physical activity:    Days per week: Not on file    Minutes per session: Not on file  . Stress: Not on file  Relationships  . Social connections:    Talks on phone: Not on file    Gets together: Not on file    Attends religious service: Not on file    Active member of club or organization: Not on file    Attends meetings of clubs or organizations: Not on file    Relationship status: Not on file  . Intimate partner violence:    Fear of current or ex partner: Not on file    Emotionally abused: Not on file    Physically abused: Not on file    Forced sexual activity: Not on file  Other Topics Concern  . Not on file  Social History Narrative  . Not on file    Current Meds  Medication Sig  . Dermatological Products, Misc. (HYLATOPIC PLUS) CREA   . doxycycline (VIBRAMYCIN) 100 MG capsule   . drospirenone-ethinyl estradiol (OCELLA) 3-0.03 MG tablet Take 1 tablet by mouth daily. CONTINUOUS DOSING  . fexofenadine (ALLEGRA) 180 MG tablet Take 180 mg by mouth daily.  . metFORMIN (GLUCOPHAGE-XR) 500 MG 24 hr tablet TAKE 2 TABLETS (1,000 MG TOTAL) BY MOUTH 2 (TWO) TIMES DAILY  . spironolactone (ALDACTONE) 100 MG tablet Take by mouth.  . tretinoin (RETIN-A) 0.05 % cream   . [DISCONTINUED] drospirenone-ethinyl estradiol (OCELLA) 3-0.03 MG tablet Take 1 tablet by mouth daily. CONTINUOUS DOSING     ROS:  Review of Systems  Constitutional: Negative for fatigue, fever and unexpected weight change.  Respiratory: Negative for cough, shortness of breath and wheezing.     Cardiovascular: Negative for chest pain, palpitations and leg swelling.  Gastrointestinal: Negative for blood in stool, constipation, diarrhea, nausea and vomiting.  Endocrine: Negative for cold intolerance, heat intolerance and polyuria.  Genitourinary: Negative for dyspareunia, dysuria, flank pain, frequency, genital sores, hematuria, menstrual problem, pelvic pain, urgency, vaginal bleeding, vaginal discharge and vaginal pain.  Musculoskeletal: Negative for back pain, joint swelling and myalgias.  Skin: Negative for rash.  Neurological: Negative for dizziness, syncope, light-headedness, numbness and headaches.  Hematological: Negative for adenopathy.  Psychiatric/Behavioral: Negative for agitation, confusion, sleep disturbance and suicidal ideas. The patient is not nervous/anxious.      Objective: BP 130/90   Pulse 96   Ht 5\' 6"  (1.676 m)   Wt 238 lb (108 kg)   LMP 08/31/2018 (Approximate)   BMI 38.41 kg/m    Physical Exam Constitutional:      Appearance: She is well-developed.  Genitourinary:     Vagina and uterus normal.     No vaginal discharge, erythema or tenderness.     No cervical motion tenderness or polyp.     Uterus is not enlarged or tender.     No right or left adnexal mass present.     Right adnexa not tender.     Left adnexa not tender.  Neck:     Musculoskeletal: Normal range of motion.     Thyroid: No thyromegaly.  Cardiovascular:     Rate and Rhythm: Normal rate and regular rhythm.     Heart sounds: Normal heart sounds. No murmur.  Pulmonary:     Effort: Pulmonary effort is normal.     Breath sounds: Normal breath sounds.  Chest:     Breasts:        Right: No mass, nipple discharge, skin change or tenderness.        Left: No mass, nipple discharge, skin change or tenderness.  Abdominal:     Palpations: Abdomen is soft.     Tenderness: There is no abdominal tenderness. There is no guarding.  Musculoskeletal: Normal range of motion.  Neurological:      Mental Status: She is alert and oriented to person, place, and time.     Cranial Nerves: No cranial nerve deficit.  Psychiatric:        Behavior: Behavior normal.  Vitals signs reviewed.     Assessment/Plan: Encounter for annual routine gynecological examination  Cervical cancer screening - Plan: Cytology - PAP  Screening for HPV (human papillomavirus) - Plan: Cytology - PAP  Encounter for surveillance of contraceptive pills - OCP RF.  PCOS (polycystic  ovarian syndrome) - Controlled with OCPs. Sees endocrine as well. - Plan: drospirenone-ethinyl estradiol (OCELLA) 3-0.03 MG tablet  Meds ordered this encounter  Medications  . drospirenone-ethinyl estradiol (OCELLA) 3-0.03 MG tablet    Sig: Take 1 tablet by mouth daily. CONTINUOUS DOSING    Dispense:  3 Package    Refill:  3    Order Specific Question:   Supervising Provider    Answer:   Gae Dry [[id number]]             GYN counsel adequate intake of calcium and vitamin D, diet and exercise     F/U  Return in about 1 year (around 09/16/2019).  Sanjna Haskew B. Wilfrido Luedke, PA-C 09/15/2018 4:24 PM

## 2018-09-15 NOTE — Patient Instructions (Signed)
I value your feedback and entrusting us with your care. If you get a Elizabeth Hurst patient survey, I would appreciate you taking the time to let us know about your experience today. Thank you! 

## 2018-09-18 LAB — CYTOLOGY - PAP
Diagnosis: NEGATIVE
HPV: NOT DETECTED

## 2019-02-22 LAB — BASIC METABOLIC PANEL
BUN: 10 (ref 4–21)
Creatinine: 0.7 (ref <=1.1)
Glucose: 99
Potassium: 4 (ref 3.4–5.3)
Sodium: 142 (ref 137–147)

## 2019-02-22 LAB — HEMOGLOBIN A1C: Hemoglobin A1C: 5.3

## 2019-04-21 ENCOUNTER — Encounter: Payer: Self-pay | Admitting: Family Medicine

## 2019-04-21 ENCOUNTER — Other Ambulatory Visit: Payer: Self-pay

## 2019-04-21 ENCOUNTER — Ambulatory Visit (INDEPENDENT_AMBULATORY_CARE_PROVIDER_SITE_OTHER): Payer: BC Managed Care – PPO | Admitting: Family Medicine

## 2019-04-21 VITALS — BP 124/88 | HR 90 | Temp 96.8°F | Resp 16 | Ht 66.0 in | Wt 227.2 lb

## 2019-04-21 DIAGNOSIS — E669 Obesity, unspecified: Secondary | ICD-10-CM | POA: Insufficient documentation

## 2019-04-21 DIAGNOSIS — Z0000 Encounter for general adult medical examination without abnormal findings: Secondary | ICD-10-CM

## 2019-04-21 DIAGNOSIS — K7581 Nonalcoholic steatohepatitis (NASH): Secondary | ICD-10-CM

## 2019-04-21 DIAGNOSIS — Z6836 Body mass index (BMI) 36.0-36.9, adult: Secondary | ICD-10-CM

## 2019-04-21 DIAGNOSIS — Z114 Encounter for screening for human immunodeficiency virus [HIV]: Secondary | ICD-10-CM | POA: Diagnosis not present

## 2019-04-21 DIAGNOSIS — E282 Polycystic ovarian syndrome: Secondary | ICD-10-CM | POA: Diagnosis not present

## 2019-04-21 DIAGNOSIS — L709 Acne, unspecified: Secondary | ICD-10-CM

## 2019-04-21 NOTE — Assessment & Plan Note (Signed)
Recheck LFTs Discussed diet and exercise 

## 2019-04-21 NOTE — Patient Instructions (Signed)
Preventive Care 21-37 Years Old, Female Preventive care refers to visits with your health care provider and lifestyle choices that can promote health and wellness. This includes:  A yearly physical exam. This may also be called an annual well check.  Regular dental visits and eye exams.  Immunizations.  Screening for certain conditions.  Healthy lifestyle choices, such as eating a healthy diet, getting regular exercise, not using drugs or products that contain nicotine and tobacco, and limiting alcohol use. What can I expect for my preventive care visit? Physical exam Your health care provider will check your:  Height and weight. This may be used to calculate body mass index (BMI), which tells if you are at a healthy weight.  Heart rate and blood pressure.  Skin for abnormal spots. Counseling Your health care provider may ask you questions about your:  Alcohol, tobacco, and drug use.  Emotional well-being.  Home and relationship well-being.  Sexual activity.  Eating habits.  Work and work environment.  Method of birth control.  Menstrual cycle.  Pregnancy history. What immunizations do I need?  Influenza (flu) vaccine  This is recommended every year. Tetanus, diphtheria, and pertussis (Tdap) vaccine  You may need a Td booster every 10 years. Varicella (chickenpox) vaccine  You may need this if you have not been vaccinated. Human papillomavirus (HPV) vaccine  If recommended by your health care provider, you may need three doses over 6 months. Measles, mumps, and rubella (MMR) vaccine  You may need at least one dose of MMR. You may also need a second dose. Meningococcal conjugate (MenACWY) vaccine  One dose is recommended if you are age 19-21 years and a first-year college student living in a residence hall, or if you have one of several medical conditions. You may also need additional booster doses. Pneumococcal conjugate (PCV13) vaccine  You may need  this if you have certain conditions and were not previously vaccinated. Pneumococcal polysaccharide (PPSV23) vaccine  You may need one or two doses if you smoke cigarettes or if you have certain conditions. Hepatitis A vaccine  You may need this if you have certain conditions or if you travel or work in places where you may be exposed to hepatitis A. Hepatitis B vaccine  You may need this if you have certain conditions or if you travel or work in places where you may be exposed to hepatitis B. Haemophilus influenzae type b (Hib) vaccine  You may need this if you have certain conditions. You may receive vaccines as individual doses or as more than one vaccine together in one shot (combination vaccines). Talk with your health care provider about the risks and benefits of combination vaccines. What tests do I need?  Blood tests  Lipid and cholesterol levels. These may be checked every 5 years starting at age 20.  Hepatitis C test.  Hepatitis B test. Screening  Diabetes screening. This is done by checking your blood sugar (glucose) after you have not eaten for a while (fasting).  Sexually transmitted disease (STD) testing.  BRCA-related cancer screening. This may be done if you have a family history of breast, ovarian, tubal, or peritoneal cancers.  Pelvic exam and Pap test. This may be done every 3 years starting at age 21. Starting at age 30, this may be done every 5 years if you have a Pap test in combination with an HPV test. Talk with your health care provider about your test results, treatment options, and if necessary, the need for more tests.   Follow these instructions at home: Eating and drinking   Eat a diet that includes fresh fruits and vegetables, whole grains, lean protein, and low-fat dairy.  Take vitamin and mineral supplements as recommended by your health care provider.  Do not drink alcohol if: ? Your health care provider tells you not to drink. ? You are  pregnant, may be pregnant, or are planning to become pregnant.  If you drink alcohol: ? Limit how much you have to 0-1 drink a day. ? Be aware of how much alcohol is in your drink. In the [place] one drink equals one 12 oz bottle of beer (355 mL), one 5 oz glass of wine (148 mL), or one 1 oz glass of hard liquor (44 mL). Lifestyle  Take daily care of your teeth and gums.  Stay active. Exercise for at least 30 minutes on 5 or more days each week.  Do not use any products that contain nicotine or tobacco, such as cigarettes, e-cigarettes, and chewing tobacco. If you need help quitting, ask your health care provider.  If you are sexually active, practice safe sex. Use a condom or other form of birth control (contraception) in order to prevent pregnancy and STIs (sexually transmitted infections). If you plan to become pregnant, see your health care provider for a preconception visit. What's next?  Visit your health care provider once a year for a well check visit.  Ask your health care provider how often you should have your eyes and teeth checked.  Stay up to date on all vaccines. This information is not intended to replace advice given to you by your health care provider. Make sure you discuss any questions you have with your health care provider. Document Released: 09/10/2001 Document Revised: 03/26/2018 Document Reviewed: 03/26/2018 [employer]vier Patient Education  2020 [employer]  

## 2019-04-21 NOTE — Progress Notes (Signed)
Patient: Elizabeth Hurst, Female    DOB: [birth year], 38 y.o.   MRN: [id number] Visit Date: 04/21/2019  Today's Provider: [name], MD   Chief Complaint  Patient presents with  . Annual Exam   Subjective:    Annual physical exam Elizabeth Hurst is a 38 y.o. female who presents today for health maintenance and complete physical. She feels well. She reports she is not actively exercising . She reports she is sleeping well.  ----------------------------------------------------------------- Last pap:09/15/2018   Review of Systems  Constitutional: Negative.   HENT: Negative.   Eyes: Negative.   Respiratory: Negative.   Cardiovascular: Negative.   Gastrointestinal: Negative.   Endocrine: Negative.   Genitourinary: Negative.   Musculoskeletal: Negative.   Skin: Negative.   Allergic/Immunologic: Positive for environmental allergies.  Neurological: Negative.   Hematological: Negative.   Psychiatric/Behavioral: Negative.     Social History She  reports that she has never smoked. She has never used smokeless tobacco. She reports current alcohol use. She reports that she does not use drugs. Social History   Socioeconomic History  . Marital status: Single    Spouse name: Not on file  . Number of children: Not on file  . Years of education: Not on file  . Highest education level: Not on file  Occupational History  . Not on file  Social Needs  . Financial resource strain: Not on file  . Food insecurity    Worry: Not on file    Inability: Not on file  . Transportation needs    Medical: Not on file    Non-medical: Not on file  Tobacco Use  . Smoking status: Never Smoker  . Smokeless tobacco: Never Used  Substance and Sexual Activity  . Alcohol use: Yes    Alcohol/week: 0.0 standard drinks  . Drug use: No  . Sexual activity: Never    Birth control/protection: Pill  Lifestyle  . Physical activity    Days per week: Not on file    Minutes per session: Not  on file  . Stress: Not on file  Relationships  . Social Herbalist on phone: Not on file    Gets together: Not on file    Attends religious service: Not on file    Active member of club or organization: Not on file    Attends meetings of clubs or organizations: Not on file    Relationship status: Not on file  Other Topics Concern  . Not on file  Social History Narrative  . Not on file    Patient Active Problem List   Diagnosis Date Noted  . Allergic rhinitis, seasonal 12/16/2017  . NASH (nonalcoholic steatohepatitis) 06/09/2015  . Acne 06/09/2015  . Bilateral polycystic ovarian syndrome 07/06/2014    History reviewed. No pertinent surgical history.  Family History  Family Status  Relation Name Status  . Mother  (Not Specified)  . Father  (Not Specified)  . MGM  (Not Specified)  . MGF  (Not Specified)  . PGM  (Not Specified)  . PGF  (Not Specified)  . Mat Aunt  (Not Specified)  . Cousin  (Not Specified)  . Cousin  (Not Specified)   Her family history includes Cancer in her cousin; Cervical cancer in her maternal aunt; Hyperlipidemia in her father, maternal grandfather, maternal grandmother, mother, paternal grandfather, and paternal grandmother; Lung cancer in her paternal grandfather; Lymphoma in her paternal grandmother; Ovarian cancer in her cousin; Stomach cancer in her  maternal aunt.     Allergies  Allergen Reactions  . Tetanus Toxoids Rash and Anaphylaxis    patient states she has a reaction to the tetnaus shot and that she was told before to take benadryl prior to recieving vaccine    Previous Medications   DERMATOLOGICAL PRODUCTS, MISC. (HYLATOPIC PLUS) CREA       DOXYCYCLINE (VIBRAMYCIN) 100 MG CAPSULE       DROSPIRENONE-ETHINYL ESTRADIOL (OCELLA) 3-0.03 MG TABLET    Take 1 tablet by mouth daily. CONTINUOUS DOSING   FEXOFENADINE (ALLEGRA) 180 MG TABLET    Take 180 mg by mouth daily.   METFORMIN (GLUCOPHAGE-XR) 500 MG 24 HR TABLET    TAKE 2 TABLETS  (1,000 MG TOTAL) BY MOUTH 2 (TWO) TIMES DAILY   SPIRONOLACTONE (ALDACTONE) 100 MG TABLET    Take by mouth.   TRETINOIN (RETIN-A) 0.05 % CREAM        Patient Care Team: Virginia Crews, MD as PCP - General (Family Medicine)      Objective:   Vitals: BP 124/88   Pulse 90   Temp (!) 96.8 F (36 C) (Oral)   Resp 16   Ht 5\' 6"  (1.676 m)   Wt 227 lb 3.2 oz (103.1 kg)   SpO2 99%   BMI 36.67 kg/m    Physical Exam Vitals signs reviewed.  Constitutional:      General: She is not in acute distress.    Appearance: Normal appearance. She is well-developed. She is not diaphoretic.  HENT:     Head: Normocephalic and atraumatic.     Right Ear: Tympanic membrane, ear canal and external ear normal.     Left Ear: Tympanic membrane, ear canal and external ear normal.  Eyes:     General: No scleral icterus.    Conjunctiva/sclera: Conjunctivae normal.     Pupils: Pupils are equal, round, and reactive to light.  Neck:     Musculoskeletal: Neck supple.     Thyroid: No thyromegaly.  Cardiovascular:     Rate and Rhythm: Normal rate and regular rhythm.     Pulses: Normal pulses.     Heart sounds: Normal heart sounds. No murmur.  Pulmonary:     Effort: Pulmonary effort is normal. No respiratory distress.     Breath sounds: Normal breath sounds. No wheezing or rales.  Abdominal:     General: There is no distension.     Palpations: Abdomen is soft.     Tenderness: There is no abdominal tenderness.  Genitourinary:    Comments: Breasts: breasts appear normal, no suspicious masses, no skin or nipple changes or axillary nodes.  Musculoskeletal:        General: No deformity.     Right lower leg: No edema.     Left lower leg: No edema.  Lymphadenopathy:     Cervical: No cervical adenopathy.  Skin:    General: Skin is warm and dry.     Capillary Refill: Capillary refill takes less than 2 seconds.     Findings: No rash.  Neurological:     Mental Status: She is alert and oriented to  person, place, and time. Mental status is at baseline.  Psychiatric:        Mood and Affect: Mood normal.        Behavior: Behavior normal.        Thought Content: Thought content normal.     Depression Screen PHQ 2/9 Scores 04/21/2019  PHQ - 2 Score 0  PHQ-  9 Score 0    Assessment & Plan:     Routine Health Maintenance and Physical Exam  Exercise Activities and Dietary recommendations Goals   None     Immunization History  Administered Date(s) Administered  . Hepatitis A, Adult 03/10/2013, 09/14/2013  . Influenza Inj Mdck Quad Pf 06/12/2017, 05/20/2018  . Influenza,inj,Quad PF,6+ Mos 06/15/2016  . Tdap 12/02/2005    Health Maintenance  Topic Date Due  . HIV Screening  02/22/1997  . TETANUS/TDAP  12/03/2015  . INFLUENZA VACCINE  02/27/2019  . PAP SMEAR-Modifier  09/16/2023     Discussed health benefits of physical activity, and encouraged her to engage in regular exercise appropriate for her age and condition.    Patient has h/o significant swelling and rash from Tetanus vaccination that got worse after 2nd vaccine.  Recommend getting if needed for cut or bite, etc and taking benadryl with it. --------------------------------------------------------------------  Problem List Items Addressed This Visit      Digestive   NASH (nonalcoholic steatohepatitis)    Recheck LFTs Discussed diet and exercise      Relevant Orders   CBC with Differential/Platelet   Hepatic function panel     Endocrine   Bilateral polycystic ovarian syndrome    Followed by OBgyn Continue spironolactone, metformin, and OCPs Screening for metabolic disorders        Musculoskeletal and Integument   Acne    Continue spironolactone and doxycycline        Other   Morbid obesity ([place])    Discussed importance of healthy weight management Discussed diet and exercise        Other Visit Diagnoses    Encounter for annual physical exam    -  Primary   Screening for HIV (human  immunodeficiency virus)       Relevant Orders   HIV antibody (with reflex)       Return in about 1 year (around 04/20/2020) for CPE.   The entirety of the information documented in the History of Present Illness, Review of Systems and Physical Exam were personally obtained by me. Portions of this information were initially documented by Hebert Soho, CMA and reviewed by me for thoroughness and accuracy.    Atzel Mccambridge, Dionne Bucy, MD MPH [facility]

## 2019-04-21 NOTE — Assessment & Plan Note (Signed)
Continue spironolactone and doxycycline

## 2019-04-21 NOTE — Assessment & Plan Note (Addendum)
Followed by OBgyn Continue spironolactone, metformin, and OCPs Screening for metabolic disorders

## 2019-04-21 NOTE — Assessment & Plan Note (Signed)
Discussed importance of healthy weight management Discussed diet and exercise  

## 2019-04-23 DIAGNOSIS — Z23 Encounter for immunization: Secondary | ICD-10-CM

## 2019-04-29 LAB — CBC WITH DIFFERENTIAL/PLATELET
Basophils Absolute: 0 10*3/uL (ref 0.0–0.2)
Basos: 0 %
EOS (ABSOLUTE): 0.1 10*3/uL (ref 0.0–0.4)
Eos: 1 %
Hematocrit: 44.3 % (ref 34.0–46.6)
Hemoglobin: 15 g/dL (ref 11.1–15.9)
Immature Grans (Abs): 0.1 10*3/uL (ref 0.0–0.1)
Immature Granulocytes: 1 %
Lymphocytes Absolute: 3.5 10*3/uL — ABNORMAL HIGH (ref 0.7–3.1)
Lymphs: 33 %
MCH: 30.2 pg (ref 26.6–33.0)
MCHC: 33.9 g/dL (ref 31.5–35.7)
MCV: 89 fL (ref 79–97)
Monocytes Absolute: 0.6 10*3/uL (ref 0.1–0.9)
Monocytes: 6 %
Neutrophils Absolute: 6.4 10*3/uL (ref 1.4–7.0)
Neutrophils: 59 %
Platelets: 334 10*3/uL (ref 150–450)
RBC: 4.97 x10E6/uL (ref 3.77–5.28)
RDW: 12.5 % (ref 11.7–15.4)
WBC: 10.7 10*3/uL (ref 3.4–10.8)

## 2019-04-29 LAB — LIPID PANEL
Chol/HDL Ratio: 3.2 ratio (ref 0.0–4.4)
Cholesterol, Total: 217 mg/dL — ABNORMAL HIGH (ref 100–199)
HDL: 67 mg/dL (ref >39)
LDL Chol Calc (NIH): 138 mg/dL — ABNORMAL HIGH (ref 0–99)
Triglycerides: 71 mg/dL (ref 0–149)
VLDL Cholesterol Cal: 12 mg/dL (ref 5–40)

## 2019-04-29 LAB — HEPATIC FUNCTION PANEL
ALT: 26 IU/L (ref 0–32)
AST: 23 IU/L (ref 0–40)
Albumin: 4.2 g/dL (ref 3.8–4.8)
Alkaline Phosphatase: 52 IU/L (ref 39–117)
Bilirubin Total: <0.2 mg/dL (ref 0.0–1.2)
Bilirubin, Direct: 0.07 mg/dL (ref 0.00–0.40)
Total Protein: 6.7 g/dL (ref 6.0–8.5)

## 2019-04-29 LAB — TSH: TSH: 1.18 u[IU]/mL (ref 0.450–4.500)

## 2019-04-29 LAB — HIV ANTIBODY (ROUTINE TESTING W REFLEX): HIV Screen 4th Generation wRfx: NONREACTIVE

## 2019-09-20 NOTE — Progress Notes (Signed)
PCP:  Virginia Crews, MD   Chief Complaint  Patient presents with  . Gynecologic Exam    spot on left breast noticed since last friday, feels like a lump under, no pain     HPI:      Elizabeth Hurst is a 39 y.o. No obstetric history on file. who LMP was Patient's last menstrual period was 08/01/2019 (approximate)., presents today for her annual examination.  Her menses are Q3 months with cont dosing OCPs, lasting 5-7 days. Dysmenorrhea mild, occurring first 1-2 days of flow. Had LLQ pain a few days after LMP that resolved on its own. No pain now. She does not have intermenstrual bleeding. She is on OCPs for PCOS. Also followed by endocrine and takes metformin and spironolactone for PCOS.   Sex activity: not sexually active.  Last Pap: 09/15/18  Results were: no abnormalities /neg HPV DNA  Hx of STDs: none  There is no FH of breast cancer. There is no FH of ovarian cancer. The patient does self-breast exams. Noticed a LT breast red area a few days ago with mass in the middle. Erythema area has lessened since first noticed. No drainage. Treated with neosporin. Never had this before.   Tobacco use: The patient denies current or previous tobacco use. Alcohol use: social No drug use.  Exercise: mod active  She does get adequate calcium but not Vitamin D in her diet. Labs with PCP  Past Medical History:  Diagnosis Date  . Polycystic ovaries     History reviewed. No pertinent surgical history.  Family History  Problem Relation Age of Onset  . Hyperlipidemia Mother   . Hyperlipidemia Father   . Hyperlipidemia Maternal Grandmother   . Hyperlipidemia Maternal Grandfather   . Hyperlipidemia Paternal Grandmother   . Lymphoma Paternal Grandmother   . Hyperlipidemia Paternal Grandfather   . Lung cancer Paternal Grandfather   . Ovarian cancer Cousin        3rd cousin    Social History   Socioeconomic History  . Marital status: Single    Spouse name: Not on file  .  Number of children: Not on file  . Years of education: Not on file  . Highest education level: Not on file  Occupational History  . Not on file  Tobacco Use  . Smoking status: Never Smoker  . Smokeless tobacco: Never Used  Substance and Sexual Activity  . Alcohol use: Yes    Alcohol/week: 0.0 standard drinks  . Drug use: No  . Sexual activity: Never    Birth control/protection: Pill  Other Topics Concern  . Not on file  Social History Narrative  . Not on file   Social Determinants of Health   Financial Resource Strain:   . Difficulty of Paying Living Expenses: Not on file  Food Insecurity:   . Worried About Charity fundraiser in the Last Year: Not on file  . Ran Out of Food in the Last Year: Not on file  Transportation Needs:   . Lack of Transportation (Medical): Not on file  . Lack of Transportation (Non-Medical): Not on file  Physical Activity:   . Days of Exercise per Week: Not on file  . Minutes of Exercise per Session: Not on file  Stress:   . Feeling of Stress : Not on file  Social Connections:   . Frequency of Communication with Friends and Family: Not on file  . Frequency of Social Gatherings with Friends and Family: Not  on file  . Attends Religious Services: Not on file  . Active Member of Clubs or Organizations: Not on file  . Attends Archivist Meetings: Not on file  . Marital Status: Not on file  Intimate Partner Violence:   . Fear of Current or Ex-Partner: Not on file  . Emotionally Abused: Not on file  . Physically Abused: Not on file  . Sexually Abused: Not on file    Current Meds  Medication Sig  . Dapsone (ACZONE) 7.5 % GEL apply ON THE SKIN daily  . Dermatological Products, Misc. (HYLATOPIC PLUS) CREA   . doxycycline (VIBRAMYCIN) 100 MG capsule   . drospirenone-ethinyl estradiol (OCELLA) 3-0.03 MG tablet Take 1 tablet by mouth daily. CONTINUOUS DOSING  . fexofenadine (ALLEGRA) 180 MG tablet Take 180 mg by mouth daily.  . metFORMIN  (GLUCOPHAGE-XR) 500 MG 24 hr tablet TAKE 2 TABLETS (1,000 MG TOTAL) BY MOUTH 2 (TWO) TIMES DAILY  . spironolactone (ALDACTONE) 100 MG tablet Take by mouth.  . tretinoin (RETIN-A) 0.05 % cream   . [DISCONTINUED] drospirenone-ethinyl estradiol (OCELLA) 3-0.03 MG tablet Take 1 tablet by mouth daily. CONTINUOUS DOSING     ROS:  Review of Systems  Constitutional: Negative for fatigue, fever and unexpected weight change.  Respiratory: Negative for cough, shortness of breath and wheezing.   Cardiovascular: Negative for chest pain, palpitations and leg swelling.  Gastrointestinal: Negative for blood in stool, constipation, diarrhea, nausea and vomiting.  Endocrine: Negative for cold intolerance, heat intolerance and polyuria.  Genitourinary: Negative for dyspareunia, dysuria, flank pain, frequency, genital sores, hematuria, menstrual problem, pelvic pain, urgency, vaginal bleeding, vaginal discharge and vaginal pain.  Musculoskeletal: Negative for back pain, joint swelling and myalgias.  Skin: Negative for rash.  Neurological: Negative for dizziness, syncope, light-headedness, numbness and headaches.  Hematological: Negative for adenopathy.  Psychiatric/Behavioral: Negative for agitation, confusion, sleep disturbance and suicidal ideas. The patient is not nervous/anxious.   BREAST: tenderness, redness   Objective: BP 140/90   Ht 5\' 6"  (1.676 m)   Wt 220 lb (99.8 kg)   LMP 08/01/2019 (Approximate)   BMI 35.51 kg/m    Physical Exam Constitutional:      Appearance: She is well-developed.  Genitourinary:     Vulva, vagina, uterus, right adnexa and left adnexa normal.     No vulval lesion or tenderness noted.     No vaginal discharge, erythema or tenderness.     No cervical motion tenderness or polyp.     Uterus is not enlarged or tender.     No right or left adnexal mass present.     Right adnexa not tender.     Left adnexa not tender.  Neck:     Thyroid: No thyromegaly.   Cardiovascular:     Rate and Rhythm: Normal rate and regular rhythm.     Heart sounds: Normal heart sounds. No murmur.  Pulmonary:     Effort: Pulmonary effort is normal.     Breath sounds: Normal breath sounds.  Chest:     Breasts:        Right: No mass, nipple discharge, skin change or tenderness.        Left: Skin change present. No mass, nipple discharge or tenderness.    Abdominal:     Palpations: Abdomen is soft.     Tenderness: There is no abdominal tenderness. There is no guarding.  Musculoskeletal:        General: Normal range of motion.     Cervical  back: Normal range of motion.  Neurological:     General: No focal deficit present.     Mental Status: She is alert and oriented to person, place, and time.     Cranial Nerves: No cranial nerve deficit.  Skin:    General: Skin is warm and dry.  Psychiatric:        Mood and Affect: Mood normal.        Behavior: Behavior normal.        Thought Content: Thought content normal.        Judgment: Judgment normal.  Vitals reviewed.     Assessment/Plan: Encounter for annual routine gynecological examination  Encounter for surveillance of contraceptive pills  PCOS (polycystic ovarian syndrome) - Controlled with OCPs. Sees endocrine as well. - Plan: drospirenone-ethinyl estradiol (OCELLA) 3-0.03 MG tablet  Breast lesion--LT breast. Looks like resolving superficial infection. Warm compresses. F/u if sx persist (for 2 more wks) or worsen for further eval.   Meds ordered this encounter  Medications  . drospirenone-ethinyl estradiol (OCELLA) 3-0.03 MG tablet    Sig: Take 1 tablet by mouth daily. CONTINUOUS DOSING    Dispense:  3 Package    Refill:  3    Order Specific Question:   Supervising Provider    Answer:   Gae Dry [id number]             GYN counsel adequate intake of calcium and vitamin D, diet and exercise     F/U  Return in about 1 year (around 09/20/2020).  Alicia B. Copland, PA-C 09/21/2019 4:13  PM

## 2019-09-21 ENCOUNTER — Encounter: Payer: Self-pay | Admitting: Obstetrics and Gynecology

## 2019-09-21 ENCOUNTER — Ambulatory Visit (INDEPENDENT_AMBULATORY_CARE_PROVIDER_SITE_OTHER): Payer: BC Managed Care – PPO | Admitting: Obstetrics and Gynecology

## 2019-09-21 ENCOUNTER — Other Ambulatory Visit: Payer: Self-pay

## 2019-09-21 VITALS — BP 140/90 | Ht 66.0 in | Wt 220.0 lb

## 2019-09-21 DIAGNOSIS — Z3041 Encounter for surveillance of contraceptive pills: Secondary | ICD-10-CM

## 2019-09-21 DIAGNOSIS — Z01411 Encounter for gynecological examination (general) (routine) with abnormal findings: Secondary | ICD-10-CM

## 2019-09-21 DIAGNOSIS — E282 Polycystic ovarian syndrome: Secondary | ICD-10-CM | POA: Diagnosis not present

## 2019-09-21 DIAGNOSIS — N6489 Other specified disorders of breast: Secondary | ICD-10-CM | POA: Diagnosis not present

## 2019-09-21 DIAGNOSIS — N649 Disorder of breast, unspecified: Secondary | ICD-10-CM

## 2019-09-21 DIAGNOSIS — Z01419 Encounter for gynecological examination (general) (routine) without abnormal findings: Secondary | ICD-10-CM

## 2019-09-21 MED ORDER — DROSPIRENONE-ETHINYL ESTRADIOL 3-0.03 MG PO TABS: 1.0000 | ORAL_TABLET | Freq: Every day | ORAL | 3 refills | Status: DC

## 2019-09-21 NOTE — Patient Instructions (Signed)
I value your feedback and entrusting us with your care. If you get a [facility] patient survey, I would appreciate you taking the time to let us know about your experience today. Thank you!  As of July 08, 2019, your lab results will be released to your MyChart immediately, before I even have a chance to see them. Please give me time to review them and contact you if there are any abnormalities. Thank you for your patience.  

## 2020-02-28 ENCOUNTER — Ambulatory Visit: Payer: BC Managed Care – PPO | Admitting: Dermatology

## 2020-02-28 ENCOUNTER — Other Ambulatory Visit: Payer: Self-pay

## 2020-02-28 DIAGNOSIS — L700 Acne vulgaris: Secondary | ICD-10-CM | POA: Diagnosis not present

## 2020-02-28 DIAGNOSIS — E282 Polycystic ovarian syndrome: Secondary | ICD-10-CM

## 2020-02-28 MED ORDER — TRETINOIN 0.05 % EX CREA
TOPICAL_CREAM | Freq: Every evening | CUTANEOUS | 5 refills | Status: AC
Start: 2020-02-28 — End: 2021-02-27

## 2020-02-28 MED ORDER — DAPSONE 7.5 % EX GEL: CUTANEOUS | 5 refills | Status: DC

## 2020-02-28 MED ORDER — DOXYCYCLINE MONOHYDRATE 100 MG PO CAPS: ORAL_CAPSULE | ORAL | 5 refills | Status: DC

## 2020-02-28 NOTE — Patient Instructions (Addendum)
Doxycycline should be taken with food to prevent nausea. Do not lay down for 30 minutes after taking. Be cautious with sun exposure and use good sun protection while on this medication. Pregnant women should not take this medication.   Spironolactone can cause increased urination and cause blood pressure to decrease. Please watch for signs of lightheadedness and be cautious when changing position. It can sometimes cause breast tenderness or an irregular period in premenopausal women. It can also increase potassium. The increase in potassium usually is not a concern unless you are taking other medicines that also increase potassium, so please be sure your doctor knows all of the other medications you are taking. This medication should not be taken  by pregnant women.  Topical retinoid medications like tretinoin/Retin-A, adapalene/Differin, tazarotene/Fabior, and Epiduo/Epiduo Forte can cause dryness and irritation when first started. Only apply a pea-sized amount to the entire affected area. Avoid applying it around the eyes, edges of mouth and creases at the nose. If you experience irritation, use a good moisturizer first and/or apply the medicine less often. If you are doing well with the medicine, you can increase how often you use it until you are applying every night. Be careful with sun protection while using this medication as it can make you sensitive to the sun. This medicine should not be used by pregnant women.   Your medications have been sent to [facility] and will be mailed to you after you call them and confirm your information with them.   [facility] [phone], [place], [place] [address]

## 2020-02-28 NOTE — Progress Notes (Signed)
   Follow-Up Visit   Subjective  Elizabeth Hurst is a 39 y.o. female who presents for the following: Follow-up. Patient here today for 6 month acne follow up. She is using Aczone 7.5% and tretinoin 0.05% topically and taking doxycycline 100mg  and spironolactone 100mg  daily. Patient advises acne is doing "really good".  The following portions of the chart were reviewed this encounter and updated as appropriate:  Tobacco  Allergies  Meds  Problems  Med Hx  Surg Hx  Fam Hx     Review of Systems:  No other skin or systemic complaints except as noted in HPI or Assessment and Plan.  Objective  Well appearing patient in no apparent distress; mood and affect are within normal limits.  A focused examination was performed including face. Relevant physical exam findings are noted in the Assessment and Plan.  Objective  face: 1 small to medium active papule of the right face otherwise clear. Acne persistent on multi drug regimen.   Assessment & Plan  Acne vulgaris - severe - better controlled on 2 oral systemic and 2 topical medications. Also with PCOS which exacerbates acne. face  BP 160/110 Patient advises her BP does run high at dr appointments. Patient declines isotretinoin treatment today after discussion.   Cont doxycycline 100mg  1 PO QD with food Cont spironolactone 100mg  1 PO QD as prescribed by GYN for PCOS Cont Aczone 7.5% QAM Cont tretinoin 0.05% QHS  Ordered Medications: doxycycline (MONODOX) 100 MG capsule tretinoin (RETIN-A) 0.05 % cream Dapsone (ACZONE) 7.5 % GEL  Return in about 6 months (around 08/30/2020) for Acne, TBSE.  Graciella Belton, RMA, am acting as scribe for Sarina Ser, MD . Documentation: I have reviewed the above documentation for accuracy and completeness, and I agree with the above.  Sarina Ser, MD

## 2020-02-29 ENCOUNTER — Encounter: Payer: Self-pay | Admitting: Dermatology

## 2020-04-21 ENCOUNTER — Encounter: Payer: Self-pay | Admitting: Family Medicine

## 2020-04-27 ENCOUNTER — Encounter: Payer: Self-pay | Admitting: Family Medicine

## 2020-04-27 ENCOUNTER — Ambulatory Visit (INDEPENDENT_AMBULATORY_CARE_PROVIDER_SITE_OTHER): Payer: BC Managed Care – PPO | Admitting: Family Medicine

## 2020-04-27 ENCOUNTER — Other Ambulatory Visit: Payer: Self-pay

## 2020-04-27 VITALS — BP 138/98 | HR 96 | Temp 98.5°F | Resp 16 | Wt 228.0 lb

## 2020-04-27 DIAGNOSIS — I10 Essential (primary) hypertension: Secondary | ICD-10-CM | POA: Insufficient documentation

## 2020-04-27 DIAGNOSIS — Z1159 Encounter for screening for other viral diseases: Secondary | ICD-10-CM | POA: Diagnosis not present

## 2020-04-27 DIAGNOSIS — Z23 Encounter for immunization: Secondary | ICD-10-CM | POA: Diagnosis not present

## 2020-04-27 DIAGNOSIS — Z0000 Encounter for general adult medical examination without abnormal findings: Secondary | ICD-10-CM | POA: Diagnosis not present

## 2020-04-27 DIAGNOSIS — K7581 Nonalcoholic steatohepatitis (NASH): Secondary | ICD-10-CM | POA: Diagnosis not present

## 2020-04-27 NOTE — Assessment & Plan Note (Signed)
Recheck LFTs Discussed diet and exercise

## 2020-04-27 NOTE — Progress Notes (Signed)
I,Elizabeth Hurst,acting as a scribe for Elizabeth Paganini, MD.,have documented all relevant documentation on the behalf of Elizabeth Paganini, MD,as directed by  Elizabeth Paganini, MD while in the presence of Elizabeth Paganini, MD.  Complete physical exam   Patient: Elizabeth Hurst   DOB: Nov 06, 1980   39 y.o. Female  MRN: [id number] Visit Date: 04/27/2020  Today's healthcare provider: [name], MD   Chief Complaint  Patient presents with  . Annual Exam   Subjective    Elizabeth Hurst is a 39 y.o. female who presents today for a complete physical exam.  She reports consuming a general diet. The patient does not participate in regular exercise at present. She generally feels fairly well. She reports sleeping fairly well. She does have additional problems to discuss today.  HPI  Last pap: 09/15/2018  Elevated blood pressure: Patient states her blood pressure reading have been elevated during office visits with her Dermatologist and Endocrinologist. Outside readings have averaged 138-162/ 93-112. Recent home blood pressure reading was 128/93.  Past Medical History:  Diagnosis Date  . Polycystic ovaries    History reviewed. No pertinent surgical history. Social History   Socioeconomic History  . Marital status: Single    Spouse name: Not on file  . Number of children: Not on file  . Years of education: Not on file  . Highest education level: Not on file  Occupational History  . Not on file  Tobacco Use  . Smoking status: Never Smoker  . Smokeless tobacco: Never Used  Vaping Use  . Vaping Use: Former  Substance and Sexual Activity  . Alcohol use: Yes    Alcohol/week: 0.0 standard drinks  . Drug use: No  . Sexual activity: Never    Birth control/protection: Pill  Other Topics Concern  . Not on file  Social History Narrative  . Not on file   Social Determinants of Health   Financial Resource Strain:   . Difficulty of Paying Living Expenses: Not on  file  Food Insecurity:   . Worried About Charity fundraiser in the Last Year: Not on file  . Ran Out of Food in the Last Year: Not on file  Transportation Needs:   . Lack of Transportation (Medical): Not on file  . Lack of Transportation (Non-Medical): Not on file  Physical Activity:   . Days of Exercise per Week: Not on file  . Minutes of Exercise per Session: Not on file  Stress:   . Feeling of Stress : Not on file  Social Connections:   . Frequency of Communication with Friends and Family: Not on file  . Frequency of Social Gatherings with Friends and Family: Not on file  . Attends Religious Services: Not on file  . Active Member of Clubs or Organizations: Not on file  . Attends Archivist Meetings: Not on file  . Marital Status: Not on file  Intimate Partner Violence:   . Fear of Current or Ex-Partner: Not on file  . Emotionally Abused: Not on file  . Physically Abused: Not on file  . Sexually Abused: Not on file   Family Status  Relation Name Status  . Mother  Alive  . Father  Alive  . MGM  Alive  . MGF  Deceased  . PGM  Deceased  . PGF  Deceased  . Cousin Maternal Deceased   Family History  Problem Relation Age of Onset  . Hyperlipidemia Mother   . Hyperlipidemia Father   .  Hyperlipidemia Maternal Grandmother   . Hyperlipidemia Maternal Grandfather   . Hyperlipidemia Paternal Grandmother   . Lymphoma Paternal Grandmother   . Hyperlipidemia Paternal Grandfather   . Lung cancer Paternal Grandfather   . Ovarian cancer Cousin        3rd cousin   Allergies  Allergen Reactions  . Tetanus Toxoids Rash    patient states she has a large local swelling and rash reaction to the tetnaus shot and that she was told before to take benadryl prior to recieving vaccine    Patient Care Team: Virginia Crews, MD as PCP - General (Family Medicine)   Medications: Outpatient Medications Prior to Visit  Medication Sig  . Dapsone (ACZONE) 7.5 % GEL apply ON THE  SKIN daily  . Dapsone (ACZONE) 7.5 % GEL Apply topically in the morning to face  . Dermatological Products, Misc. (HYLATOPIC PLUS) CREA   . doxycycline (MONODOX) 100 MG capsule Take 1 by mouth once daily with food  . doxycycline (VIBRAMYCIN) 100 MG capsule   . drospirenone-ethinyl estradiol (OCELLA) 3-0.03 MG tablet Take 1 tablet by mouth daily. CONTINUOUS DOSING  . fexofenadine (ALLEGRA) 180 MG tablet Take 180 mg by mouth daily.  . metFORMIN (GLUCOPHAGE-XR) 500 MG 24 hr tablet TAKE 2 TABLETS (1,000 MG TOTAL) BY MOUTH 2 (TWO) TIMES DAILY  . spironolactone (ALDACTONE) 100 MG tablet Take by mouth.  . tretinoin (RETIN-A) 0.05 % cream   . tretinoin (RETIN-A) 0.05 % cream Apply topically at bedtime.   No facility-administered medications prior to visit.    Review of Systems  Constitutional: Negative for chills, fatigue and fever.  HENT: Negative for congestion, ear pain, rhinorrhea, sneezing and sore throat.   Eyes: Negative.  Negative for pain and redness.  Respiratory: Negative for cough, shortness of breath and wheezing.   Cardiovascular: Negative for chest pain and leg swelling.  Gastrointestinal: Negative for abdominal pain, blood in stool, constipation, diarrhea and nausea.  Endocrine: Negative for polydipsia and polyphagia.  Genitourinary: Negative.  Negative for dysuria, flank pain, hematuria, pelvic pain, vaginal bleeding and vaginal discharge.  Musculoskeletal: Negative for arthralgias, back pain, gait problem and joint swelling.  Skin: Negative for rash.  Allergic/Immunologic: Positive for environmental allergies.  Neurological: Negative.  Negative for dizziness, tremors, seizures, weakness, light-headedness, numbness and headaches.  Hematological: Negative for adenopathy.  Psychiatric/Behavioral: Negative.  Negative for behavioral problems, confusion and dysphoric mood. The patient is not nervous/anxious and is not hyperactive.         Objective    BP (!) 138/98 (BP  Location: Left Arm, Patient Position: Sitting, Cuff Size: Large)   Pulse 96   Temp 98.5 F (36.9 C) (Oral)   Resp 16   Wt 228 lb (103.4 kg)   SpO2 97% Comment: room air  BMI 36.80 kg/m  BP Readings from Last 3 Encounters:  04/27/20 (!) 138/98  09/21/19 140/90  04/21/19 124/88      Physical Exam Vitals reviewed.  Constitutional:      General: She is not in acute distress.    Appearance: Normal appearance. She is well-developed. She is not diaphoretic.  HENT:     Head: Normocephalic and atraumatic.     Right Ear: Tympanic membrane, ear canal and external ear normal.     Left Ear: Tympanic membrane, ear canal and external ear normal.  Eyes:     General: No scleral icterus.    Conjunctiva/sclera: Conjunctivae normal.     Pupils: Pupils are equal, round, and reactive to light.  Neck:     Thyroid: No thyromegaly.  Cardiovascular:     Rate and Rhythm: Normal rate and regular rhythm.     Pulses: Normal pulses.     Heart sounds: Normal heart sounds. No murmur heard.   Pulmonary:     Effort: Pulmonary effort is normal. No respiratory distress.     Breath sounds: Normal breath sounds. No wheezing or rales.  Abdominal:     General: There is no distension.     Palpations: Abdomen is soft.     Tenderness: There is no abdominal tenderness.  Musculoskeletal:        General: No deformity.     Cervical back: Neck supple.     Right lower leg: No edema.     Left lower leg: No edema.  Lymphadenopathy:     Cervical: No cervical adenopathy.  Skin:    General: Skin is warm and dry.     Findings: No rash.  Neurological:     Mental Status: She is alert and oriented to person, place, and time. Mental status is at baseline.     Gait: Gait normal.  Psychiatric:        Mood and Affect: Mood normal.        Behavior: Behavior normal.        Thought Content: Thought content normal.       Last depression screening scores PHQ 2/9 Scores 04/27/2020 04/21/2019  PHQ - 2 Score 0 0  PHQ- 9  Score 0 0   Last fall risk screening Fall Risk  04/27/2020  Falls in the past year? 0  Number falls in past yr: 0  Injury with Fall? 0  Follow up Falls evaluation completed   Last Audit-C alcohol use screening Alcohol Use Disorder Test (AUDIT) 04/27/2020  1. How often do you have a drink containing alcohol? 3  2. How many drinks containing alcohol do you have on a typical day when you are drinking? 0  3. How often do you have six or more drinks on one occasion? 0  AUDIT-C Score 3  4. How often during the last year have you found that you were not able to stop drinking once you had started? 0  5. How often during the last year have you failed to do what was normally expected from you because of drinking? 0  6. How often during the last year have you needed a first drink in the morning to get yourself going after a heavy drinking session? 0  7. How often during the last year have you had a feeling of guilt of remorse after drinking? 0  8. How often during the last year have you been unable to remember what happened the night before because you had been drinking? 0  9. Have you or someone else been injured as a result of your drinking? 0  10. Has a relative or friend or a doctor or another health worker been concerned about your drinking or suggested you cut down? 0  Alcohol Use Disorder Identification Test Final Score (AUDIT) 3  Alcohol Brief Interventions/Follow-up AUDIT Score <7 follow-up not indicated   A score of 3 or more in women, and 4 or more in men indicates increased risk for alcohol abuse, EXCEPT if all of the points are from question 1   No results found for any visits on 04/27/20.  Assessment & Plan    Routine Health Maintenance and Physical Exam  Exercise Activities and Dietary recommendations Goals  None     Immunization History  Administered Date(s) Administered  . Hepatitis A, Adult 03/10/2013, 09/14/2013  . Influenza Inj Mdck Quad Pf 06/12/2017, 05/20/2018  .  Influenza,inj,Quad PF,6+ Mos 06/15/2016, 04/23/2019, 04/27/2020  . Moderna SARS-COVID-2 Vaccination 09/23/2019, 10/21/2019  . Tdap 12/02/2005    Health Maintenance  Topic Date Due  . Hepatitis C Screening  Never done  . PAP SMEAR-Modifier  09/16/2023  . INFLUENZA VACCINE  Completed  . COVID-19 Vaccine  Completed  . HIV Screening  Completed    Discussed health benefits of physical activity, and encouraged her to engage in regular exercise appropriate for her age and condition.  Problem List Items Addressed This Visit      Cardiovascular and Mediastinum   Hypertension    New diagnosis Multiple elevated blood pressure readings at home and at other doctors offices Will give 3 months for lifestyle interventions before considering medication Patient is asymptomatic with no red flags Discussed low-sodium diet and exercise Follow-up in 3 months Recheck metabolic panel        Digestive   NASH (nonalcoholic steatohepatitis)    Recheck LFTs Discussed diet and exercise      Relevant Orders   Comprehensive metabolic panel     Other   Morbid obesity ([place])    BMI of 36 and associated with hypertension and Karlene Lineman Discussed importance of healthy weight management Discussed diet and exercise       Relevant Orders   Comprehensive metabolic panel   Lipid panel    Other Visit Diagnoses    Encounter for annual physical exam    -  Primary   Relevant Orders   Hepatitis C Antibody   Comprehensive metabolic panel   Lipid panel   Need for hepatitis C screening test       Relevant Orders   Hepatitis C Antibody   Need for influenza vaccination       Relevant Orders   Flu Vaccine QUAD 36+ mos IM (Fluarix/Fluzone) (Completed)       Return in about 3 months (around 07/27/2020) for BP f/u.     I, Elizabeth Paganini, MD, have reviewed all documentation for this visit. The documentation on 04/27/20 for the exam, diagnosis, procedures, and orders are all accurate and  complete.   Cassius Cullinane, Dionne Bucy, MD, MPH [facility]

## 2020-04-27 NOTE — Patient Instructions (Addendum)
Preventive Care 21-39 Years Old, Female Preventive care refers to visits with your health care provider and lifestyle choices that can promote health and wellness. This includes:  A yearly physical exam. This may also be called an annual well check.  Regular dental visits and eye exams.  Immunizations.  Screening for certain conditions.  Healthy lifestyle choices, such as eating a healthy diet, getting regular exercise, not using drugs or products that contain nicotine and tobacco, and limiting alcohol use. What can I expect for my preventive care visit? Physical exam Your health care provider will check your:  Height and weight. This may be used to calculate body mass index (BMI), which tells if you are at a healthy weight.  Heart rate and blood pressure.  Skin for abnormal spots. Counseling Your health care provider may ask you questions about your:  Alcohol, tobacco, and drug use.  Emotional well-being.  Home and relationship well-being.  Sexual activity.  Eating habits.  Work and work environment.  Method of birth control.  Menstrual cycle.  Pregnancy history. What immunizations do I need?  Influenza (flu) vaccine  This is recommended every year. Tetanus, diphtheria, and pertussis (Tdap) vaccine  You may need a Td booster every 10 years. Varicella (chickenpox) vaccine  You may need this if you have not been vaccinated. Human papillomavirus (HPV) vaccine  If recommended by your health care provider, you may need three doses over 6 months. Measles, mumps, and rubella (MMR) vaccine  You may need at least one dose of MMR. You may also need a second dose. Meningococcal conjugate (MenACWY) vaccine  One dose is recommended if you are age 19-21 years and a first-year college student living in a residence hall, or if you have one of several medical conditions. You may also need additional booster doses. Pneumococcal conjugate (PCV13) vaccine  You may need  this if you have certain conditions and were not previously vaccinated. Pneumococcal polysaccharide (PPSV23) vaccine  You may need one or two doses if you smoke cigarettes or if you have certain conditions. Hepatitis A vaccine  You may need this if you have certain conditions or if you travel or work in places where you may be exposed to hepatitis A. Hepatitis B vaccine  You may need this if you have certain conditions or if you travel or work in places where you may be exposed to hepatitis B. Haemophilus influenzae type b (Hib) vaccine  You may need this if you have certain conditions. You may receive vaccines as individual doses or as more than one vaccine together in one shot (combination vaccines). Talk with your health care provider about the risks and benefits of combination vaccines. What tests do I need?  Blood tests  Lipid and cholesterol levels. These may be checked every 5 years starting at age 20.  Hepatitis C test.  Hepatitis B test. Screening  Diabetes screening. This is done by checking your blood sugar (glucose) after you have not eaten for a while (fasting).  Sexually transmitted disease (STD) testing.  BRCA-related cancer screening. This may be done if you have a family history of breast, ovarian, tubal, or peritoneal cancers.  Pelvic exam and Pap test. This may be done every 3 years starting at age 21. Starting at age 30, this may be done every 5 years if you have a Pap test in combination with an HPV test. Talk with your health care provider about your test results, treatment options, and if necessary, the need for more tests.   Follow these instructions at home: Eating and drinking   Eat a diet that includes fresh fruits and vegetables, whole grains, lean protein, and low-fat dairy.  Take vitamin and mineral supplements as recommended by your health care provider.  Do not drink alcohol if: ? Your health care provider tells you not to drink. ? You are  pregnant, may be pregnant, or are planning to become pregnant.  If you drink alcohol: ? Limit how much you have to 0-1 drink a day. ? Be aware of how much alcohol is in your drink. In the [place] one drink equals one 12 oz bottle of beer (355 mL), one 5 oz glass of wine (148 mL), or one 1 oz glass of hard liquor (44 mL). Lifestyle  Take daily care of your teeth and gums.  Stay active. Exercise for at least 30 minutes on 5 or more days each week.  Do not use any products that contain nicotine or tobacco, such as cigarettes, e-cigarettes, and chewing tobacco. If you need help quitting, ask your health care provider.  If you are sexually active, practice safe sex. Use a condom or other form of birth control (contraception) in order to prevent pregnancy and STIs (sexually transmitted infections). If you plan to become pregnant, see your health care provider for a preconception visit. What's next?  Visit your health care provider once a year for a well check visit.  Ask your health care provider how often you should have your eyes and teeth checked.  Stay up to date on all vaccines. This information is not intended to replace advice given to you by your health care provider. Make sure you discuss any questions you have with your health care provider. Document Revised: 03/26/2018 Document Reviewed: 03/26/2018 Elsevier Patient Education  [place].     Low-Sodium Eating Plan Sodium, which is an element that makes up salt, helps you maintain a healthy balance of fluids in your body. Too much sodium can increase your blood pressure and cause fluid and waste to be held in your body. Your health care provider or dietitian may recommend following this plan if you have high blood pressure (hypertension), kidney disease, liver disease, or heart failure. Eating less sodium can help lower your blood pressure, reduce swelling, and protect your heart, liver, and kidneys. What are tips for  following this plan? General guidelines  Most people on this plan should limit their sodium intake to 1,500-2,000 mg (milligrams) of sodium each day. Reading food labels   The Nutrition Facts label lists the amount of sodium in one serving of the food. If you eat more than one serving, you must multiply the listed amount of sodium by the number of servings.  Choose foods with less than 140 mg of sodium per serving.  Avoid foods with 300 mg of sodium or more per serving. Shopping  Look for lower-sodium products, often labeled as "low-sodium" or "no salt added."  Always check the sodium content even if foods are labeled as "unsalted" or "no salt added".  Buy fresh foods. ? Avoid canned foods and premade or frozen meals. ? Avoid canned, cured, or processed meats  Buy breads that have less than 80 mg of sodium per slice. Cooking  Eat more home-cooked food and less restaurant, buffet, and fast food.  Avoid adding salt when cooking. Use salt-free seasonings or herbs instead of table salt or sea salt. Check with your health care provider or pharmacist before using salt substitutes.  Cook with  plant-based oils, such as canola, sunflower, or olive oil. Meal planning  When eating at a restaurant, ask that your food be prepared with less salt or no salt, if possible.  Avoid foods that contain MSG (monosodium glutamate). MSG is sometimes added to [place] food, bouillon, and some canned foods. What foods are recommended? The items listed may not be a complete list. Talk with your dietitian about what dietary choices are best for you. Grains Low-sodium cereals, including oats, puffed wheat and rice, and shredded wheat. Low-sodium crackers. Unsalted rice. Unsalted pasta. Low-sodium bread. Whole-grain breads and whole-grain pasta. Vegetables Fresh or frozen vegetables. "No salt added" canned vegetables. "No salt added" tomato sauce and paste. Low-sodium or reduced-sodium tomato and vegetable  juice. Fruits Fresh, frozen, or canned fruit. Fruit juice. Meats and other protein foods Fresh or frozen (no salt added) meat, poultry, seafood, and fish. Low-sodium canned tuna and salmon. Unsalted nuts. Dried peas, beans, and lentils without added salt. Unsalted canned beans. Eggs. Unsalted nut butters. Dairy Milk. Soy milk. Cheese that is naturally low in sodium, such as ricotta cheese, fresh mozzarella, or Swiss cheese Low-sodium or reduced-sodium cheese. Cream cheese. Yogurt. Fats and oils Unsalted butter. Unsalted margarine with no trans fat. Vegetable oils such as canola or olive oils. Seasonings and other foods Fresh and dried herbs and spices. Salt-free seasonings. Low-sodium mustard and ketchup. Sodium-free salad dressing. Sodium-free light mayonnaise. Fresh or refrigerated horseradish. Lemon juice. Vinegar. Homemade, reduced-sodium, or low-sodium soups. Unsalted popcorn and pretzels. Low-salt or salt-free chips. What foods are not recommended? The items listed may not be a complete list. Talk with your dietitian about what dietary choices are best for you. Grains Instant hot cereals. Bread stuffing, pancake, and biscuit mixes. Croutons. Seasoned rice or pasta mixes. Noodle soup cups. Boxed or frozen macaroni and cheese. Regular salted crackers. Self-rising flour. Vegetables Sauerkraut, pickled vegetables, and relishes. Olives. [place] fries. Onion rings. Regular canned vegetables (not low-sodium or reduced-sodium). Regular canned tomato sauce and paste (not low-sodium or reduced-sodium). Regular tomato and vegetable juice (not low-sodium or reduced-sodium). Frozen vegetables in sauces. Meats and other protein foods Meat or fish that is salted, canned, smoked, spiced, or pickled. Bacon, ham, sausage, hotdogs, corned beef, chipped beef, packaged lunch meats, salt pork, jerky, pickled herring, anchovies, regular canned tuna, sardines, salted nuts. Dairy Processed cheese and cheese spreads.  Cheese curds. Blue cheese. Feta cheese. String cheese. Regular cottage cheese. Buttermilk. Canned milk. Fats and oils Salted butter. Regular margarine. Ghee. Bacon fat. Seasonings and other foods Onion salt, garlic salt, seasoned salt, table salt, and sea salt. Canned and packaged gravies. Worcestershire sauce. Tartar sauce. Barbecue sauce. Teriyaki sauce. Soy sauce, including reduced-sodium. Steak sauce. Fish sauce. Oyster sauce. Cocktail sauce. Horseradish that you find on the shelf. Regular ketchup and mustard. Meat flavorings and tenderizers. Bouillon cubes. Hot sauce and Tabasco sauce. Premade or packaged marinades. Premade or packaged taco seasonings. Relishes. Regular salad dressings. Salsa. Potato and tortilla chips. Corn chips and puffs. Salted popcorn and pretzels. Canned or dried soups. Pizza. Frozen entrees and pot pies. Summary  Eating less sodium can help lower your blood pressure, reduce swelling, and protect your heart, liver, and kidneys.  Most people on this plan should limit their sodium intake to 1,500-2,000 mg (milligrams) of sodium each day.  Canned, boxed, and frozen foods are high in sodium. Restaurant foods, fast foods, and pizza are also very high in sodium. You also get sodium by adding salt to food.  Try to cook at home,  eat more fresh fruits and vegetables, and eat less fast food, canned, processed, or prepared foods. This information is not intended to replace advice given to you by your health care provider. Make sure you discuss any questions you have with your health care provider. Document Revised: 06/27/2017 Document Reviewed: 07/08/2016 Elsevier Patient Education  2020 [employer].

## 2020-04-27 NOTE — Assessment & Plan Note (Signed)
New diagnosis Multiple elevated blood pressure readings at home and at other doctors offices Will give 3 months for lifestyle interventions before considering medication Patient is asymptomatic with no red flags Discussed low-sodium diet and exercise Follow-up in 3 months Recheck metabolic panel

## 2020-04-27 NOTE — Assessment & Plan Note (Signed)
BMI of 36 and associated with hypertension and Elizabeth Hurst Discussed importance of healthy weight management Discussed diet and exercise

## 2020-05-06 LAB — LIPID PANEL
Chol/HDL Ratio: 3.2 ratio (ref 0.0–4.4)
Cholesterol, Total: 207 mg/dL — ABNORMAL HIGH (ref 100–199)
HDL: 65 mg/dL (ref >39)
LDL Chol Calc (NIH): 125 mg/dL — ABNORMAL HIGH (ref 0–99)
Triglycerides: 93 mg/dL (ref 0–149)
VLDL Cholesterol Cal: 17 mg/dL (ref 5–40)

## 2020-05-06 LAB — COMPREHENSIVE METABOLIC PANEL
ALT: 38 IU/L — ABNORMAL HIGH (ref 0–32)
AST: 24 IU/L (ref 0–40)
Albumin/Globulin Ratio: 1.6 (ref 1.2–2.2)
Albumin: 4.1 g/dL (ref 3.8–4.8)
Alkaline Phosphatase: 53 IU/L (ref 44–121)
BUN/Creatinine Ratio: 11 (ref 9–23)
BUN: 8 mg/dL (ref 6–20)
Bilirubin Total: 0.3 mg/dL (ref 0.0–1.2)
CO2: 26 mmol/L (ref 20–29)
Calcium: 9.4 mg/dL (ref 8.7–10.2)
Chloride: 100 mmol/L (ref 96–106)
Creatinine, Ser: 0.7 mg/dL (ref 0.57–1.00)
GFR calc Af Amer: 127 mL/min/{1.73_m2} (ref >59)
GFR calc non Af Amer: 110 mL/min/{1.73_m2} (ref >59)
Globulin, Total: 2.6 g/dL (ref 1.5–4.5)
Glucose: 94 mg/dL (ref 65–99)
Potassium: 4.2 mmol/L (ref 3.5–5.2)
Sodium: 140 mmol/L (ref 134–144)
Total Protein: 6.7 g/dL (ref 6.0–8.5)

## 2020-05-06 LAB — HEPATITIS C ANTIBODY: Hep C Virus Ab: <0.1 s/co ratio (ref 0.0–0.9)

## 2020-07-31 ENCOUNTER — Other Ambulatory Visit: Payer: Self-pay | Admitting: Obstetrics and Gynecology

## 2020-07-31 DIAGNOSIS — E282 Polycystic ovarian syndrome: Secondary | ICD-10-CM

## 2020-08-01 ENCOUNTER — Other Ambulatory Visit: Payer: Self-pay

## 2020-08-01 ENCOUNTER — Ambulatory Visit: Payer: BC Managed Care – PPO | Admitting: Family Medicine

## 2020-08-01 ENCOUNTER — Encounter: Payer: Self-pay | Admitting: Family Medicine

## 2020-08-01 VITALS — BP 125/86 | HR 88 | Temp 98.7°F | Wt 228.0 lb

## 2020-08-01 DIAGNOSIS — I10 Essential (primary) hypertension: Secondary | ICD-10-CM | POA: Diagnosis not present

## 2020-08-01 NOTE — Assessment & Plan Note (Signed)
Well controlled today Home readings still have high diastolic - advised to bring BP cuff to next visit to compare to ours She has been working on lifestyle interventions and low-sodium diet She also resume spironolactone, which she was taking for PCOS/hirsutism, but is also likely helping with her blood pressure, so we will continue at this time Recheck metabolic panel Follow-up in 3 to 6 months

## 2020-08-01 NOTE — Progress Notes (Signed)
Established patient visit   Patient: Elizabeth Hurst   DOB: 11-10-1980   40 y.o. Female  MRN: [id number] Visit Date: 08/01/2020  Today's healthcare provider: [name], MD   Chief Complaint  Patient presents with  . Hypertension   Subjective    HPI  Hypertension, follow-up  BP Readings from Last 3 Encounters:  08/01/20 125/86  04/27/20 (!) 138/98  09/21/19 140/90   Wt Readings from Last 3 Encounters:  08/01/20 228 lb (103.4 kg)  04/27/20 228 lb (103.4 kg)  09/21/19 220 lb (99.8 kg)     She was last seen for hypertension 3 months ago.  BP at that visit was 138/98. Management since that visit includes works on lifestyle changes.  She reports excellent compliance with treatment. She is not having side effects. She is following a Low Sodium diet. She is exercising. She does not smoke.  Use of agents associated with hypertension: oral contraceptives.   Outside blood pressures are 120-130/90's. Symptoms: No chest pain No chest pressure  No palpitations No syncope  No dyspnea No orthopnea   No lower extremity edema   Pertinent labs: Lab Results  Component Value Date   CHOL 207 (H) 05/05/2020   HDL 65 05/05/2020   LDLCALC 125 (H) 05/05/2020   TRIG 93 05/05/2020   CHOLHDL 3.2 05/05/2020   Lab Results  Component Value Date   NA 140 05/05/2020   K 4.2 05/05/2020   CREATININE 0.70 05/05/2020   GFRNONAA 110 05/05/2020   GFRAA 127 05/05/2020   GLUCOSE 94 05/05/2020     The ASCVD Risk score (Goff DC Jr., et al., 2013) failed to calculate for the following reasons:   The 2013 ASCVD risk score is only valid for ages 48 to 65   ---------------------------------------------------------------------------------------------------   Patient Active Problem List   Diagnosis Date Noted  . Hypertension 04/27/2020  . Morbid obesity ([place]) 04/21/2019  . Allergic rhinitis, seasonal 12/16/2017  . NASH (nonalcoholic steatohepatitis) 06/09/2015  . Acne  06/09/2015  . Bilateral polycystic ovarian syndrome 07/06/2014   Past Medical History:  Diagnosis Date  . Polycystic ovaries    Social History   Tobacco Use  . Smoking status: Never Smoker  . Smokeless tobacco: Never Used  Vaping Use  . Vaping Use: Former  Substance Use Topics  . Alcohol use: Yes    Alcohol/week: 0.0 standard drinks  . Drug use: No   Allergies  Allergen Reactions  . Tetanus Toxoids Rash    patient states she has a large local swelling and rash reaction to the tetnaus shot and that she was told before to take benadryl prior to recieving vaccine     Medications: Outpatient Medications Prior to Visit  Medication Sig  . Dapsone (ACZONE) 7.5 % GEL Apply topically in the morning to face  . Dermatological Products, Misc. (HYLATOPIC PLUS) CREA   . doxycycline (MONODOX) 100 MG capsule Take 1 by mouth once daily with food  . doxycycline (VIBRAMYCIN) 100 MG capsule   . drospirenone-ethinyl estradiol (OCELLA) 3-0.03 MG tablet Take 1 tablet by mouth daily. CONTINUOUS DOSING  . fexofenadine (ALLEGRA) 180 MG tablet Take 180 mg by mouth daily.  . metFORMIN (GLUCOPHAGE-XR) 500 MG 24 hr tablet TAKE 2 TABLETS (1,000 MG TOTAL) BY MOUTH 2 (TWO) TIMES DAILY  . spironolactone (ALDACTONE) 100 MG tablet Take by mouth.  . tretinoin (RETIN-A) 0.05 % cream   . tretinoin (RETIN-A) 0.05 % cream Apply topically at bedtime.  . Dapsone (ACZONE) 7.5 %  GEL apply ON THE SKIN daily   No facility-administered medications prior to visit.    Review of Systems  Constitutional: Negative.   Respiratory: Negative.   Cardiovascular: Negative.   Neurological: Negative.   Psychiatric/Behavioral: Negative.       Objective    BP 125/86 (BP Location: Left Arm, Patient Position: Sitting, Cuff Size: Large)   Pulse 88   Temp 98.7 F (37.1 C) (Oral)   Wt 228 lb (103.4 kg)   BMI 36.80 kg/m    Physical Exam Vitals reviewed.  Constitutional:      General: She is not in acute distress.     Appearance: Normal appearance. She is well-developed. She is not diaphoretic.  HENT:     Head: Normocephalic and atraumatic.  Eyes:     General: No scleral icterus.    Conjunctiva/sclera: Conjunctivae normal.  Neck:     Thyroid: No thyromegaly.  Cardiovascular:     Rate and Rhythm: Normal rate and regular rhythm.     Pulses: Normal pulses.     Heart sounds: Normal heart sounds. No murmur heard.   Pulmonary:     Effort: Pulmonary effort is normal. No respiratory distress.     Breath sounds: Normal breath sounds. No wheezing, rhonchi or rales.  Musculoskeletal:     Cervical back: Neck supple.     Right lower leg: No edema.     Left lower leg: No edema.  Lymphadenopathy:     Cervical: No cervical adenopathy.  Skin:    General: Skin is warm and dry.     Findings: No rash.  Neurological:     Mental Status: She is alert and oriented to person, place, and time. Mental status is at baseline.  Psychiatric:        Mood and Affect: Mood normal.        Behavior: Behavior normal.       No results found for any visits on 08/01/20.  Assessment & Plan     Problem List Items Addressed This Visit      Cardiovascular and Mediastinum   Hypertension - Primary    Well controlled today Home readings still have high diastolic - advised to bring BP cuff to next visit to compare to ours She has been working on lifestyle interventions and low-sodium diet She also resume spironolactone, which she was taking for PCOS/hirsutism, but is also likely helping with her blood pressure, so we will continue at this time Recheck metabolic panel Follow-up in 3 to 6 months      Relevant Orders   Basic Metabolic Panel (BMET)       Return in about 5 months (around 12/30/2020) for chronic disease f/u.      I, Shirlee Latch, MD, have reviewed all documentation for this visit. The documentation on 08/01/20 for the exam, diagnosis, procedures, and orders are all accurate and complete.   Carlisha Wisler,  Marzella Schlein, MD, MPH [facility]

## 2020-08-02 ENCOUNTER — Telehealth: Payer: Self-pay

## 2020-08-02 LAB — BASIC METABOLIC PANEL
BUN/Creatinine Ratio: 15 (ref 9–23)
BUN: 12 mg/dL (ref 6–20)
CO2: 23 mmol/L (ref 20–29)
Calcium: 9.6 mg/dL (ref 8.7–10.2)
Chloride: 104 mmol/L (ref 96–106)
Creatinine, Ser: 0.79 mg/dL (ref 0.57–1.00)
GFR calc Af Amer: 110 mL/min/{1.73_m2} (ref >59)
GFR calc non Af Amer: 95 mL/min/{1.73_m2} (ref >59)
Glucose: 88 mg/dL (ref 65–99)
Potassium: 4.5 mmol/L (ref 3.5–5.2)
Sodium: 142 mmol/L (ref 134–144)

## 2020-08-02 NOTE — Telephone Encounter (Signed)
-----   Message from Erasmo Downer, MD sent at 08/02/2020 10:20 AM EST ----- Normal labs

## 2020-08-02 NOTE — Telephone Encounter (Signed)
Pt called and messaged regarding information below has been received.

## 2020-08-31 ENCOUNTER — Ambulatory Visit: Payer: BC Managed Care – PPO | Admitting: Dermatology

## 2020-09-25 ENCOUNTER — Ambulatory Visit: Payer: BC Managed Care – PPO | Admitting: Dermatology

## 2020-09-27 ENCOUNTER — Other Ambulatory Visit: Payer: Self-pay

## 2020-09-27 ENCOUNTER — Ambulatory Visit (INDEPENDENT_AMBULATORY_CARE_PROVIDER_SITE_OTHER): Payer: BC Managed Care – PPO | Admitting: Obstetrics and Gynecology

## 2020-09-27 ENCOUNTER — Encounter: Payer: Self-pay | Admitting: Obstetrics and Gynecology

## 2020-09-27 VITALS — BP 120/60 | Ht 66.0 in | Wt 227.0 lb

## 2020-09-27 DIAGNOSIS — Z01419 Encounter for gynecological examination (general) (routine) without abnormal findings: Secondary | ICD-10-CM

## 2020-09-27 DIAGNOSIS — E282 Polycystic ovarian syndrome: Secondary | ICD-10-CM

## 2020-09-27 DIAGNOSIS — Z3189 Encounter for other procreative management: Secondary | ICD-10-CM

## 2020-09-27 DIAGNOSIS — Z3041 Encounter for surveillance of contraceptive pills: Secondary | ICD-10-CM | POA: Diagnosis not present

## 2020-09-27 MED ORDER — SPIRONOLACTONE 100 MG PO TABS: 100.0000 mg | ORAL_TABLET | Freq: Every day | ORAL | 3 refills | Status: DC

## 2020-09-27 MED ORDER — DROSPIRENONE-ETHINYL ESTRADIOL 3-0.03 MG PO TABS: 1.0000 | ORAL_TABLET | Freq: Every day | ORAL | 4 refills | Status: DC

## 2020-09-27 NOTE — Patient Instructions (Signed)
I value your feedback and you entrusting us with your care. If you get a [facility] patient survey, I would appreciate you taking the time to let us know about your experience today. Thank you! ? ? ?

## 2020-09-27 NOTE — Progress Notes (Signed)
PCP:  Virginia Crews, MD   Chief Complaint  Patient presents with  . Gynecologic Exam    No concerns     HPI:      [name] is a 40 y.o. No obstetric history on file. who LMP was Patient's last menstrual period was 09/07/2020 (approximate)., presents today for her annual examination.  Her menses are Q3 months with cont dosing OCPs, lasting 8-9 days; real flow for about 4 days, then rest is light or spotting; mod flow on heavier days. Dysmenorrhea mild, occurring first 1-2 days of flow. She does not have intermenstrual bleeding. She is on OCPs for PCOS. Also was followed by endocrine and takes metformin and spironolactone for PCOS. Needs spironolactone RF. PCP does metformin.  Sex activity: never sexually active. Would like REI ref for fertility options.  Last Pap: 09/15/18  Results were: no abnormalities /neg HPV DNA  Hx of STDs: none  There is no FH of breast cancer. There is no FH of ovarian cancer. The patient does not do self-breast exams.   Tobacco use: The patient denies current or previous tobacco use. Alcohol use: social No drug use.  Exercise: mod active  She does get adequate calcium but not Vitamin D in her diet. Labs with PCP  Past Medical History:  Diagnosis Date  . Hypertension   . Polycystic ovaries     History reviewed. No pertinent surgical history.  Family History  Problem Relation Age of Onset  . Hyperlipidemia Mother   . Hyperlipidemia Father   . Hyperlipidemia Maternal Grandmother   . Hyperlipidemia Maternal Grandfather   . Hyperlipidemia Paternal Grandmother   . Lymphoma Paternal Grandmother   . Hyperlipidemia Paternal Grandfather   . Lung cancer Paternal Grandfather   . Ovarian cancer Cousin        3rd cousin    Social History   Socioeconomic History  . Marital status: Single    Spouse name: Not on file  . Number of children: Not on file  . Years of education: Not on file  . Highest education level: Not on file   Occupational History  . Not on file  Tobacco Use  . Smoking status: Never Smoker  . Smokeless tobacco: Never Used  Vaping Use  . Vaping Use: Former  Substance and Sexual Activity  . Alcohol use: Yes    Alcohol/week: 0.0 standard drinks  . Drug use: No  . Sexual activity: Never    Birth control/protection: Pill  Other Topics Concern  . Not on file  Social History Narrative  . Not on file   Social Determinants of Health   Financial Resource Strain: Not on file  Food Insecurity: Not on file  Transportation Needs: Not on file  Physical Activity: Not on file  Stress: Not on file  Social Connections: Not on file  Intimate Partner Violence: Not on file    Current Meds  Medication Sig  . Dapsone (ACZONE) 7.5 % GEL Apply topically in the morning to face  . Dapsone 7.5 % GEL apply ON THE SKIN daily  . Dermatological Products, Misc. (HYLATOPIC PLUS) CREA   . doxycycline (MONODOX) 100 MG capsule Take 1 by mouth once daily with food  . doxycycline (VIBRAMYCIN) 100 MG capsule   . fexofenadine (ALLEGRA) 180 MG tablet Take 180 mg by mouth daily.  . metFORMIN (GLUCOPHAGE-XR) 500 MG 24 hr tablet TAKE 2 TABLETS (1,000 MG TOTAL) BY MOUTH 2 (TWO) TIMES DAILY  . tretinoin (RETIN-A) 0.05 %  cream   . tretinoin (RETIN-A) 0.05 % cream Apply topically at bedtime.  . [DISCONTINUED] drospirenone-ethinyl estradiol (OCELLA) 3-0.03 MG tablet Take 1 tablet by mouth daily. CONTINUOUS DOSING  . [DISCONTINUED] spironolactone (ALDACTONE) 100 MG tablet Take by mouth.     ROS:  Review of Systems  Constitutional: Negative for fatigue, fever and unexpected weight change.  Respiratory: Negative for cough, shortness of breath and wheezing.   Cardiovascular: Negative for chest pain, palpitations and leg swelling.  Gastrointestinal: Negative for blood in stool, constipation, diarrhea, nausea and vomiting.  Endocrine: Negative for cold intolerance, heat intolerance and polyuria.  Genitourinary: Negative for  dyspareunia, dysuria, flank pain, frequency, genital sores, hematuria, menstrual problem, pelvic pain, urgency, vaginal bleeding, vaginal discharge and vaginal pain.  Musculoskeletal: Negative for back pain, joint swelling and myalgias.  Skin: Negative for rash.  Neurological: Negative for dizziness, syncope, light-headedness, numbness and headaches.  Hematological: Negative for adenopathy.  Psychiatric/Behavioral: Negative for agitation, confusion, sleep disturbance and suicidal ideas. The patient is not nervous/anxious.   BREAST: tenderness, redness   Objective: BP 120/60   Ht 5\' 6"  (1.676 m)   Wt 227 lb (103 kg)   LMP 09/07/2020 (Approximate)   BMI 36.64 kg/m    Physical Exam Constitutional:      Appearance: She is well-developed.  Genitourinary:     Vulva normal.     Right Labia: No rash, tenderness or lesions.    Left Labia: No tenderness, lesions or rash.    No vaginal discharge, erythema or tenderness.      Right Adnexa: not tender and no mass present.    Left Adnexa: not tender and no mass present.    No cervical motion tenderness, friability or polyp.     Uterus is not enlarged or tender.  Breasts:     Right: No mass, nipple discharge, skin change or tenderness.     Left: Skin change present. No mass, nipple discharge or tenderness.    Neck:     Thyroid: No thyromegaly.  Cardiovascular:     Rate and Rhythm: Normal rate and regular rhythm.     Heart sounds: Normal heart sounds. No murmur heard.   Pulmonary:     Effort: Pulmonary effort is normal.     Breath sounds: Normal breath sounds.  Chest:    Abdominal:     Palpations: Abdomen is soft.     Tenderness: There is no abdominal tenderness. There is no guarding or rebound.  Musculoskeletal:        General: Normal range of motion.     Cervical back: Normal range of motion.  Lymphadenopathy:     Cervical: No cervical adenopathy.  Neurological:     General: No focal deficit present.     Mental Status:  She is alert and oriented to person, place, and time.     Cranial Nerves: No cranial nerve deficit.  Skin:    General: Skin is warm and dry.  Psychiatric:        Mood and Affect: Mood normal.        Behavior: Behavior normal.        Thought Content: Thought content normal.        Judgment: Judgment normal.  Vitals reviewed.     Assessment/Plan: Encounter for annual routine gynecological examination  Encounter for surveillance of contraceptive pills - Plan: drospirenone-ethinyl estradiol (OCELLA) 3-0.03 MG tablet; OCP RF  PCOS (polycystic ovarian syndrome) - Plan: Ambulatory referral to Infertility, spironolactone (ALDACTONE) 100 MG tablet, drospirenone-ethinyl estradiol (  OCELLA) 3-0.03 MG tablet; cont OCPs, spironolactone. Rx RF. F/u prn.   Encounter for fertility planning - Plan: Ambulatory referral to Infertility; pt would like to discuss future fertility, possible freezing eggs. Refer to REI.    Meds ordered this encounter  Medications  . spironolactone (ALDACTONE) 100 MG tablet    Sig: Take 1 tablet (100 mg total) by mouth daily.    Dispense:  90 tablet    Refill:  3    Order Specific Question:   Supervising Provider    Answer:   Gae Dry [id number]  . drospirenone-ethinyl estradiol (OCELLA) 3-0.03 MG tablet    Sig: Take 1 tablet by mouth daily. CONTINUOUS DOSING    Dispense:  84 tablet    Refill:  4    Order Specific Question:   Supervising Provider    Answer:   Gae Dry [[id number]]             GYN counsel adequate intake of calcium and vitamin D, diet and exercise     F/U  Return in about 1 year (around 09/27/2021).  Marlowe Cinquemani B. Karmin Kasprzak, PA-C 09/27/2020 4:31 PM

## 2020-10-04 ENCOUNTER — Other Ambulatory Visit: Payer: Self-pay | Admitting: Dermatology

## 2020-10-04 DIAGNOSIS — L700 Acne vulgaris: Secondary | ICD-10-CM

## 2020-12-18 ENCOUNTER — Ambulatory Visit: Payer: BC Managed Care – PPO | Admitting: Dermatology

## 2020-12-20 ENCOUNTER — Ambulatory Visit: Payer: BC Managed Care – PPO | Admitting: Dermatology

## 2021-01-08 ENCOUNTER — Ambulatory Visit: Payer: BC Managed Care – PPO | Admitting: Family Medicine

## 2021-01-22 ENCOUNTER — Other Ambulatory Visit: Payer: Self-pay | Admitting: Dermatology

## 2021-01-22 DIAGNOSIS — L700 Acne vulgaris: Secondary | ICD-10-CM

## 2021-01-22 NOTE — Telephone Encounter (Signed)
Patient needs appointment for further refills

## 2021-03-05 ENCOUNTER — Ambulatory Visit: Payer: BC Managed Care – PPO | Admitting: Family Medicine

## 2021-03-09 ENCOUNTER — Other Ambulatory Visit: Payer: Self-pay

## 2021-03-09 ENCOUNTER — Ambulatory Visit: Payer: BC Managed Care – PPO | Admitting: Family Medicine

## 2021-03-09 ENCOUNTER — Encounter: Payer: Self-pay | Admitting: Family Medicine

## 2021-03-09 VITALS — BP 105/72 | HR 98 | Temp 98.2°F | Resp 16 | Ht 66.0 in | Wt 213.4 lb

## 2021-03-09 DIAGNOSIS — E282 Polycystic ovarian syndrome: Secondary | ICD-10-CM

## 2021-03-09 DIAGNOSIS — Z6834 Body mass index (BMI) 34.0-34.9, adult: Secondary | ICD-10-CM

## 2021-03-09 DIAGNOSIS — E669 Obesity, unspecified: Secondary | ICD-10-CM | POA: Diagnosis not present

## 2021-03-09 DIAGNOSIS — I10 Essential (primary) hypertension: Secondary | ICD-10-CM | POA: Diagnosis not present

## 2021-03-09 DIAGNOSIS — K7581 Nonalcoholic steatohepatitis (NASH): Secondary | ICD-10-CM | POA: Diagnosis not present

## 2021-03-09 NOTE — Assessment & Plan Note (Signed)
Congratulated on weight loss ?Discussed importance of healthy weight management ?Discussed diet and exercise  ?

## 2021-03-09 NOTE — Assessment & Plan Note (Signed)
F/b endocrinology Continue metformin and spironolactone

## 2021-03-09 NOTE — Progress Notes (Signed)
Established patient visit   Patient: Elizabeth Hurst   DOB: [birth year]   40 y.o. Female  MRN: [id number]  Visit Date: 03/09/2021  Today's healthcare provider: [name], MD   Chief Complaint  Patient presents with   Hypertension   Subjective     Hypertension, follow-up  BP Readings from Last 3 Encounters:  03/09/21 105/72  09/27/20 120/60  08/01/20 125/86   Wt Readings from Last 3 Encounters:  03/09/21 213 lb 6.4 oz (96.8 kg)  09/27/20 227 lb (103 kg)  08/01/20 228 lb (103.4 kg)     She was last seen for hypertension 7 months ago.  BP at that visit was 125/86. Management since that visit includes resume spironolactone.  She reports excellent compliance with treatment. She is not having side effects.  She denies dizziness and light-headedness. She is following a Regular diet. She is not exercising. She does not smoke.  Use of agents associated with hypertension: none.   Outside blood pressures are not being checked.  Symptoms: No chest pain No chest pressure  No palpitations No syncope  No dyspnea No orthopnea  No paroxysmal nocturnal dyspnea No lower extremity edema   Pertinent labs: Lab Results  Component Value Date   CHOL 207 (H) 05/05/2020   HDL 65 05/05/2020   LDLCALC 125 (H) 05/05/2020   TRIG 93 05/05/2020   CHOLHDL 3.2 05/05/2020   Lab Results  Component Value Date   NA 142 08/01/2020   K 4.5 08/01/2020   CREATININE 0.79 08/01/2020   GFRNONAA 95 08/01/2020   GFRAA 110 08/01/2020   GLUCOSE 88 08/01/2020     The ASCVD Risk score (Goff DC Jr., et al., 2013) failed to calculate for the following reasons:   The 2013 ASCVD risk score is only valid for ages 9 to 67   ---------------------------------------------------------------------------------------------------  Patient Active Problem List   Diagnosis Date Noted   Hypertension 04/27/2020   Obesity 04/21/2019   Allergic rhinitis, seasonal 12/16/2017   NASH (nonalcoholic  steatohepatitis) 06/09/2015   Acne 06/09/2015   PCOS (polycystic ovarian syndrome) 07/06/2014   Social History   Tobacco Use   Smoking status: Never   Smokeless tobacco: Never  Vaping Use   Vaping Use: Former  Substance Use Topics   Alcohol use: Yes    Alcohol/week: 0.0 standard drinks   Drug use: No   Allergies  Allergen Reactions   Tetanus Toxoids Rash    patient states she has a large local swelling and rash reaction to the tetnaus shot and that she was told before to take benadryl prior to recieving vaccine     Medications: Outpatient Medications Prior to Visit  Medication Sig   Dapsone (ACZONE) 7.5 % GEL Apply topically in the morning to face   Dermatological Products, Misc. (HYLATOPIC PLUS) CREA    doxycycline (MONODOX) 100 MG capsule TAKE 1 CAPSULE BY MOUTH ONCE DAILY WITH FOOD   drospirenone-ethinyl estradiol (OCELLA) 3-0.03 MG tablet Take 1 tablet by mouth daily. CONTINUOUS DOSING   fexofenadine (ALLEGRA) 180 MG tablet Take 180 mg by mouth daily.   metFORMIN (GLUCOPHAGE-XR) 500 MG 24 hr tablet TAKE 2 TABLETS (1,000 MG TOTAL) BY MOUTH 2 (TWO) TIMES DAILY   spironolactone (ALDACTONE) 100 MG tablet Take 1 tablet (100 mg total) by mouth daily.   tretinoin (RETIN-A) 0.05 % cream    [DISCONTINUED] Dapsone 7.5 % GEL apply ON THE SKIN daily   [DISCONTINUED] doxycycline (VIBRAMYCIN) 100 MG capsule    No facility-administered medications  prior to visit.    Review of Systems  Constitutional:  Negative for activity change, appetite change, chills, fatigue and fever.  HENT:  Negative for ear pain, sinus pressure, sinus pain and sore throat.   Eyes:  Negative for pain and visual disturbance.  Respiratory:  Negative for cough, chest tightness, shortness of breath and wheezing.   Cardiovascular:  Negative for chest pain, palpitations and leg swelling.  Gastrointestinal:  Negative for abdominal pain, blood in stool, diarrhea, nausea and vomiting.  Genitourinary:  Negative for  flank pain, frequency, pelvic pain and urgency.  Musculoskeletal:  Negative for back pain, myalgias and neck pain.  Neurological:  Negative for dizziness, weakness, light-headedness, numbness and headaches.       Objective    BP 105/72 (BP Location: Left Arm, Patient Position: Sitting, Cuff Size: Large)   Pulse 98   Temp 98.2 F (36.8 C) (Oral)   Resp 16   Ht '5\' 6"'$  (1.676 m)   Wt 213 lb 6.4 oz (96.8 kg)   BMI 34.44 kg/m  BP Readings from Last 3 Encounters:  03/09/21 105/72  09/27/20 120/60  08/01/20 125/86   Wt Readings from Last 3 Encounters:  03/09/21 213 lb 6.4 oz (96.8 kg)  09/27/20 227 lb (103 kg)  08/01/20 228 lb (103.4 kg)   Physical Exam Vitals reviewed.  Constitutional:      General: She is not in acute distress.    Appearance: Normal appearance. She is well-developed. She is not diaphoretic.  HENT:     Head: Normocephalic and atraumatic.  Eyes:     General: No scleral icterus.    Conjunctiva/sclera: Conjunctivae normal.  Neck:     Thyroid: No thyromegaly.  Cardiovascular:     Rate and Rhythm: Normal rate and regular rhythm.     Pulses: Normal pulses.     Heart sounds: Normal heart sounds. No murmur heard. Pulmonary:     Effort: Pulmonary effort is normal. No respiratory distress.     Breath sounds: Normal breath sounds. No wheezing, rhonchi or rales.  Musculoskeletal:     Cervical back: Neck supple.     Right lower leg: No edema.     Left lower leg: No edema.  Lymphadenopathy:     Cervical: No cervical adenopathy.  Skin:    General: Skin is warm and dry.     Findings: No rash.  Neurological:     Mental Status: She is alert and oriented to person, place, and time. Mental status is at baseline.  Psychiatric:        Mood and Affect: Mood normal.        Behavior: Behavior normal.    No results found for any visits on 03/09/21.  Assessment & Plan     Problem List Items Addressed This Visit       Cardiovascular and Mediastinum   Hypertension -  Primary    Well controlled Continue current medications Recheck metabolic panel F/u in 6 months       Relevant Orders   Comprehensive metabolic panel     Digestive   NASH (nonalcoholic steatohepatitis)    Recheck LFTs      Relevant Orders   Comprehensive metabolic panel     Other   Obesity    Congratulated on weight loss Discussed importance of healthy weight management Discussed diet and exercise       Relevant Orders   Comprehensive metabolic panel   Lipid panel    Return in about 6 months (around  09/09/2021) for CPE.      I,Essence Turner,acting as a Education administrator for [name], MD.,have documented all relevant documentation on the behalf of [name], MD,as directed by  [name], MD while in the presence of [name], MD.  I, [name], MD, have reviewed all documentation for this visit. The documentation on 03/09/21 for the exam, diagnosis, procedures, and orders are all accurate and complete.   Susana Duell, Dionne Bucy, MD, MPH [facility]

## 2021-03-09 NOTE — Assessment & Plan Note (Signed)
Well controlled Continue current medications Recheck metabolic panel F/u in 6 months  

## 2021-03-09 NOTE — Assessment & Plan Note (Signed)
Recheck LFTs 

## 2021-03-10 LAB — COMPREHENSIVE METABOLIC PANEL
ALT: 36 IU/L — ABNORMAL HIGH (ref 0–32)
AST: 28 IU/L (ref 0–40)
Albumin/Globulin Ratio: 1.6 (ref 1.2–2.2)
Albumin: 4.3 g/dL (ref 3.8–4.8)
Alkaline Phosphatase: 49 IU/L (ref 44–121)
BUN/Creatinine Ratio: 14 (ref 9–23)
BUN: 10 mg/dL (ref 6–20)
Bilirubin Total: 0.2 mg/dL (ref 0.0–1.2)
CO2: 23 mmol/L (ref 20–29)
Calcium: 9.9 mg/dL (ref 8.7–10.2)
Chloride: 101 mmol/L (ref 96–106)
Creatinine, Ser: 0.69 mg/dL (ref 0.57–1.00)
Globulin, Total: 2.7 g/dL (ref 1.5–4.5)
Glucose: 100 mg/dL — ABNORMAL HIGH (ref 65–99)
Potassium: 4.7 mmol/L (ref 3.5–5.2)
Sodium: 138 mmol/L (ref 134–144)
Total Protein: 7 g/dL (ref 6.0–8.5)
eGFR: 113 mL/min/{1.73_m2} (ref >59)

## 2021-03-10 LAB — LIPID PANEL
Chol/HDL Ratio: 3 ratio (ref 0.0–4.4)
Cholesterol, Total: 210 mg/dL — ABNORMAL HIGH (ref 100–199)
HDL: 70 mg/dL (ref >39)
LDL Chol Calc (NIH): 122 mg/dL — ABNORMAL HIGH (ref 0–99)
Triglycerides: 101 mg/dL (ref 0–149)
VLDL Cholesterol Cal: 18 mg/dL (ref 5–40)

## 2021-03-31 ENCOUNTER — Other Ambulatory Visit: Payer: Self-pay | Admitting: Dermatology

## 2021-03-31 DIAGNOSIS — L700 Acne vulgaris: Secondary | ICD-10-CM

## 2021-05-08 ENCOUNTER — Encounter: Payer: BC Managed Care – PPO | Admitting: Family Medicine

## 2021-06-11 ENCOUNTER — Other Ambulatory Visit: Payer: Self-pay

## 2021-06-11 ENCOUNTER — Emergency Department: Payer: BC Managed Care – PPO

## 2021-06-11 ENCOUNTER — Observation Stay
Admission: EM | Admit: 2021-06-11 | Discharge: 2021-06-12 | Disposition: A | Discharge status: Home / Self Care | Payer: BC Managed Care – PPO | Attending: Internal Medicine | Admitting: Internal Medicine

## 2021-06-11 DIAGNOSIS — E119 Type 2 diabetes mellitus without complications: Secondary | ICD-10-CM | POA: Diagnosis not present

## 2021-06-11 DIAGNOSIS — R778 Other specified abnormalities of plasma proteins: Secondary | ICD-10-CM | POA: Diagnosis not present

## 2021-06-11 DIAGNOSIS — E669 Obesity, unspecified: Secondary | ICD-10-CM | POA: Diagnosis present

## 2021-06-11 DIAGNOSIS — I160 Hypertensive urgency: Secondary | ICD-10-CM | POA: Insufficient documentation

## 2021-06-11 DIAGNOSIS — Z7984 Long term (current) use of oral hypoglycemic drugs: Secondary | ICD-10-CM | POA: Diagnosis not present

## 2021-06-11 DIAGNOSIS — R000 Tachycardia, unspecified: Secondary | ICD-10-CM | POA: Insufficient documentation

## 2021-06-11 DIAGNOSIS — I10 Essential (primary) hypertension: Secondary | ICD-10-CM | POA: Insufficient documentation

## 2021-06-11 DIAGNOSIS — I214 Non-ST elevation (NSTEMI) myocardial infarction: Principal | ICD-10-CM | POA: Insufficient documentation

## 2021-06-11 DIAGNOSIS — R55 Syncope and collapse: Secondary | ICD-10-CM | POA: Diagnosis present

## 2021-06-11 DIAGNOSIS — E041 Nontoxic single thyroid nodule: Secondary | ICD-10-CM

## 2021-06-11 DIAGNOSIS — Z79899 Other long term (current) drug therapy: Secondary | ICD-10-CM | POA: Insufficient documentation

## 2021-06-11 DIAGNOSIS — R42 Dizziness and giddiness: Secondary | ICD-10-CM

## 2021-06-11 DIAGNOSIS — Z20822 Contact with and (suspected) exposure to covid-19: Secondary | ICD-10-CM | POA: Insufficient documentation

## 2021-06-11 DIAGNOSIS — E282 Polycystic ovarian syndrome: Secondary | ICD-10-CM | POA: Diagnosis present

## 2021-06-11 DIAGNOSIS — K7581 Nonalcoholic steatohepatitis (NASH): Secondary | ICD-10-CM | POA: Diagnosis present

## 2021-06-11 HISTORY — DX: Hypertensive urgency: I16.0

## 2021-06-11 LAB — URINALYSIS, ROUTINE W REFLEX MICROSCOPIC
Bilirubin Urine: NEGATIVE
Glucose, UA: NEGATIVE mg/dL
Ketones, ur: NEGATIVE mg/dL
Leukocytes,Ua: NEGATIVE
Nitrite: NEGATIVE
Protein, ur: 30 mg/dL — AB
Specific Gravity, Urine: 1.015 (ref 1.005–1.030)
pH: 7 (ref 5.0–8.0)

## 2021-06-11 LAB — CBC
HCT: 45.4 % (ref 36.0–46.0)
Hemoglobin: 15.1 g/dL — ABNORMAL HIGH (ref 12.0–15.0)
MCH: 30.5 pg (ref 26.0–34.0)
MCHC: 33.3 g/dL (ref 30.0–36.0)
MCV: 91.7 fL (ref 80.0–100.0)
Platelets: 375 10*3/uL (ref 150–400)
RBC: 4.95 MIL/uL (ref 3.87–5.11)
RDW: 12 % (ref 11.5–15.5)
WBC: 13.7 10*3/uL — ABNORMAL HIGH (ref 4.0–10.5)
nRBC: 0 % (ref 0.0–0.2)

## 2021-06-11 LAB — TROPONIN I (HIGH SENSITIVITY)
Troponin I (High Sensitivity): 25 ng/L — ABNORMAL HIGH (ref <18)
Troponin I (High Sensitivity): 113 ng/L (ref <18)

## 2021-06-11 LAB — BASIC METABOLIC PANEL
Anion gap: 9 (ref 5–15)
BUN: 13 mg/dL (ref 6–20)
CO2: 25 mmol/L (ref 22–32)
Calcium: 9.1 mg/dL (ref 8.9–10.3)
Chloride: 102 mmol/L (ref 98–111)
Creatinine, Ser: 0.86 mg/dL (ref 0.44–1.00)
GFR, Estimated: >60 mL/min (ref >60)
Glucose, Bld: 150 mg/dL — ABNORMAL HIGH (ref 70–99)
Potassium: 3.7 mmol/L (ref 3.5–5.1)
Sodium: 136 mmol/L (ref 135–145)

## 2021-06-11 LAB — RESP PANEL BY RT-PCR (FLU A&B, COVID) ARPGX2
Influenza A by PCR: NEGATIVE
Influenza B by PCR: NEGATIVE
SARS Coronavirus 2 by RT PCR: NEGATIVE

## 2021-06-11 LAB — HIV ANTIBODY (ROUTINE TESTING W REFLEX): HIV Screen 4th Generation wRfx: NONREACTIVE

## 2021-06-11 LAB — D-DIMER, QUANTITATIVE: D-Dimer, Quant: 0.32 ug/mL-FEU (ref 0.00–0.50)

## 2021-06-11 LAB — MAGNESIUM: Magnesium: 2.1 mg/dL (ref 1.7–2.4)

## 2021-06-11 LAB — PREGNANCY, URINE: Preg Test, Ur: NEGATIVE

## 2021-06-11 MED ORDER — HEPARIN BOLUS VIA INFUSION
4000.0000 [IU] | Freq: Once | INTRAVENOUS | Status: AC
  Administered 2021-06-11: 4000 [IU] via INTRAVENOUS
  Filled 2021-06-11: qty 4000

## 2021-06-11 MED ORDER — ATORVASTATIN CALCIUM 20 MG PO TABS
20.0000 mg | ORAL_TABLET | Freq: Every day | ORAL | Status: DC
  Administered 2021-06-11: 20 mg via ORAL
  Filled 2021-06-11: qty 1

## 2021-06-11 MED ORDER — SPIRONOLACTONE 25 MG PO TABS: 100.0000 mg | ORAL_TABLET | Freq: Every day | ORAL | Status: DC

## 2021-06-11 MED ORDER — HEPARIN (PORCINE) 25000 UT/250ML-% IV SOLN
1100.0000 [IU]/h | INTRAVENOUS | Status: DC
Start: 2021-06-11 — End: 2021-06-12
  Administered 2021-06-11: 1100 [IU]/h via INTRAVENOUS
  Filled 2021-06-11: qty 250

## 2021-06-11 MED ORDER — ONDANSETRON HCL 4 MG/2ML IJ SOLN: 4.0000 mg | Freq: Four times a day (QID) | INTRAMUSCULAR | Status: DC | PRN

## 2021-06-11 MED ORDER — METOPROLOL TARTRATE 25 MG PO TABS
25.0000 mg | ORAL_TABLET | Freq: Two times a day (BID) | ORAL | Status: DC
  Administered 2021-06-11: 25 mg via ORAL
  Filled 2021-06-11: qty 1

## 2021-06-11 MED ORDER — ASPIRIN EC 81 MG PO TBEC: 81.0000 mg | DELAYED_RELEASE_TABLET | Freq: Every day | ORAL | Status: DC

## 2021-06-11 MED ORDER — IOHEXOL 350 MG/ML SOLN
75.0000 mL | Freq: Once | INTRAVENOUS | Status: AC | PRN
  Administered 2021-06-11: 75 mL via INTRAVENOUS
  Filled 2021-06-11: qty 75

## 2021-06-11 MED ORDER — NITROGLYCERIN 0.4 MG SL SUBL: 0.4000 mg | SUBLINGUAL_TABLET | SUBLINGUAL | Status: DC | PRN

## 2021-06-11 MED ORDER — ACETAMINOPHEN 325 MG PO TABS: 650.0000 mg | ORAL_TABLET | ORAL | Status: DC | PRN

## 2021-06-11 MED ORDER — DOXYCYCLINE HYCLATE 100 MG PO TABS
100.0000 mg | ORAL_TABLET | Freq: Every day | ORAL | Status: DC
  Administered 2021-06-11: 100 mg via ORAL
  Filled 2021-06-11: qty 1

## 2021-06-11 MED ORDER — SODIUM CHLORIDE 0.9 % IV BOLUS
1000.0000 mL | Freq: Once | INTRAVENOUS | Status: AC
  Administered 2021-06-11: 1000 mL via INTRAVENOUS

## 2021-06-11 NOTE — Progress Notes (Signed)
ANTICOAGULATION CONSULT NOTE  Pharmacy Consult for heparin monitoring/adjustments Indication: chest pain/ACS  Patient Measurements: Height: 5\' 6"  (167.6 cm) Weight: 96.8 kg (213 lb 6.5 oz) IBW/kg (Calculated) : 59.3 Heparin Dosing Weight: 80.9 kg  Vital Signs: Temp: 97 F (36.1 C) (11/14 1604) Temp Source: Oral (11/14 1604) BP: 148/99 (11/14 1604) Pulse Rate: 118 (11/14 1604)  Labs: Recent Labs    06/11/21 1256 06/11/21 1653  HGB 15.1*  --   HCT 45.4  --   PLT 375  --   CREATININE 0.86  --   TROPONINIHS 25* 113*    Estimated Creatinine Clearance: 103 mL/min (by C-G formula based on SCr of 0.86 mg/dL).   Medical History: Past Medical History:  Diagnosis Date   Hypertension    Polycystic ovaries     Medications: she is on no chronic anticoagulation prior to admission based a review of medical records (medication reconciliation not yet complete)  Assessment: 40 y.o. female with PMH of hypertension, diabetes, PCOS, and NASH who presents to the ED complaining of near syncope. Her initial troponin is elevated and trending up  Goal of Therapy:  Heparin level 0.3-0.7 units/ml Monitor platelets by anticoagulation protocol: Yes   Plan:  Give 4000 units bolus x 1 Start heparin infusion at 1100 units/hr Check anti-Xa level in 6 hours and daily while on heparin Continue to monitor H&H and platelets  Dallie Piles 06/11/2021,6:00 PM

## 2021-06-11 NOTE — ED Notes (Signed)
Called times 2   no answer

## 2021-06-11 NOTE — H&P (Signed)
History and Physical    Elizabeth Hurst LKG:[id number] DOB: January 25, 1981 DOA: 06/11/2021  PCP: Virginia Crews, MD   Patient coming from: Home  I have personally briefly reviewed patient's relevant medical records in [place]  Chief Complaint: Lightheadedness  HPI: Elizabeth Hurst is a 40 y.o. 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  She had no associated nausea, vomiting or diaphoresis.  Denies cough, shortness of breath fever or chills.  Was previously in her usual state of health.  Denies abdominal pain or diarrhea.  Chest tightness was in the mid chest and brief of mild intensity and nonradiating and was resolved by arrival in the ED.  She denies headache and one-sided numbness tingling or weakness.  Denies lower extremity pain or swelling.  ED course: On arrival, BP 150/110 and tachycardic to 122 with otherwise normal vitals Blood work: D-dimer normal at 0.32.  Troponin 25> 113.  WBC 13,000, glucose 150, urinalysis with few bacteria.  Urine pregnancy test negative  EKG, personally viewed and interpreted: Sinus tachycardia at 122 with no acute ST-T wave changes  CT angio chest: No PE or acute intrathoracic abnormality.  Right thyroid nodule measuring 16 mm.  Recommend thyroid ultrasound  Patient started on heparin.  Hospitalist consulted for admission    Review of Systems: As per HPI otherwise all other systems on review of systems negative.    Past Medical History:  Diagnosis Date   Hypertension    Polycystic ovaries     History reviewed. No pertinent surgical history.   reports that she has never smoked. She has never used smokeless tobacco. She reports current alcohol use. She reports that she does not use drugs.  Allergies  Allergen Reactions    Tetanus Toxoids Rash    patient states she has a large local swelling and rash reaction to the tetnaus shot and that she was told before to take benadryl prior to recieving vaccine    Family History  Problem Relation Age of Onset   Hyperlipidemia Mother    Hyperlipidemia Father    Hyperlipidemia Maternal Grandmother    Hyperlipidemia Maternal Grandfather    Hyperlipidemia Paternal Grandmother    Lymphoma Paternal Grandmother    Hyperlipidemia Paternal Grandfather    Lung cancer Paternal Grandfather    Ovarian cancer Cousin        3rd cousin      Prior to Admission medications   Medication Sig Start Date End Date Taking? Authorizing Provider  Dapsone (ACZONE) 7.5 % GEL Apply topically in the morning to face 02/28/20  Yes Ralene Bathe, MD  Dermatological Products, Misc. (HYLATOPIC PLUS) CREA  03/07/15  Yes [provider]  doxycycline (MONODOX) 100 MG capsule TAKE 1 CAPSULE BY MOUTH ONCE DAILY WITH FOOD 04/03/21  Yes Ralene Bathe, MD  drospirenone-ethinyl estradiol (OCELLA) 3-0.03 MG tablet Take 1 tablet by mouth daily. CONTINUOUS DOSING 01/02/43  Yes Copland, Deirdre Evener, PA-C  fexofenadine (ALLEGRA) 180 MG tablet Take 180 mg by mouth daily.   Yes [provider]  metFORMIN (GLUCOPHAGE-XR) 500 MG 24 hr tablet TAKE 2 TABLETS (1,000 MG TOTAL) BY MOUTH 2 (TWO) TIMES DAILY 04/01/15  Yes [provider]  spironolactone (ALDACTONE) 100 MG tablet Take 1 tablet (100 mg total) by mouth daily. 0/3/47  Yes Copland, Elmo Putt  B, PA-C  tretinoin (RETIN-A) 0.05 % cream  05/22/18  Yes [provider]    Physical Exam: Vitals:   06/11/21 1257 06/11/21 1421 06/11/21 1604  BP: (!) 150/110  (!) 148/99  Pulse: (!) 122  (!) 118  Resp: 18  18  Temp: 98 F (36.7 C)  (!) 97 F (36.1 C)  TempSrc:   Oral  SpO2: 98%  98%  Weight:  96.8 kg   Height:  5\' 6"  (1.676 m)    Constitutional: Alert and oriented x 3 . Not in any apparent distress HEENT:      Head: Normocephalic  and atraumatic.         Eyes: PERLA, EOMI, Conjunctivae are normal. Sclera is non-icteric.       Mouth/Throat: Mucous membranes are moist.       Neck: Supple with no signs of meningismus. Cardiovascular: Regular rate and rhythm. No murmurs, gallops, or rubs. 2+ symmetrical distal pulses are present . No JVD. No  LE edema Respiratory: Respiratory effort normal .Lungs sounds clear bilaterally. No wheezes, crackles, or rhonchi.  Gastrointestinal: Soft, non tender, non distended. Positive bowel sounds.  Genitourinary: No CVA tenderness. Musculoskeletal: Nontender with normal range of motion in all extremities. No cyanosis, or erythema of extremities. Neurologic:  Face is symmetric. Moving all extremities. No gross focal neurologic deficits . Skin: Skin is warm, dry.  No rash or ulcers Psychiatric: Mood and affect are appropriate    Labs on Admission: I have personally reviewed following labs and imaging studies  CBC: Recent Labs  Lab 06/11/21 1256  WBC 13.7*  HGB 15.1*  HCT 45.4  MCV 91.7  PLT 297   Basic Metabolic Panel: Recent Labs  Lab 06/11/21 1256  NA 136  K 3.7  CL 102  CO2 25  GLUCOSE 150*  BUN 13  CREATININE 0.86  CALCIUM 9.1   GFR: Estimated Creatinine Clearance: 103 mL/min (by C-G formula based on SCr of 0.86 mg/dL). Liver Function Tests: No results for input(s): AST, ALT, ALKPHOS, BILITOT, PROT, ALBUMIN in the last 168 hours. No results for input(s): LIPASE, AMYLASE in the last 168 hours. No results for input(s): AMMONIA in the last 168 hours. Coagulation Profile: No results for input(s): INR, PROTIME in the last 168 hours. Cardiac Enzymes: No results for input(s): CKTOTAL, CKMB, CKMBINDEX, TROPONINI in the last 168 hours. BNP (last 3 results) No results for input(s): PROBNP in the last 8760 hours. HbA1C: No results for input(s): HGBA1C in the last 72 hours. CBG: No results for input(s): GLUCAP in the last 168 hours. Lipid Profile: No results for  input(s): CHOL, HDL, LDLCALC, TRIG, CHOLHDL, LDLDIRECT in the last 72 hours. Thyroid Function Tests: No results for input(s): TSH, T4TOTAL, FREET4, T3FREE, THYROIDAB in the last 72 hours. Anemia Panel: No results for input(s): VITAMINB12, FOLATE, FERRITIN, TIBC, IRON, RETICCTPCT in the last 72 hours. Urine analysis:    Component Value Date/Time   COLORURINE YELLOW (A) 06/11/2021 1256   APPEARANCEUR HAZY (A) 06/11/2021 1256   LABSPEC 1.015 06/11/2021 1256   PHURINE 7.0 06/11/2021 1256   GLUCOSEU NEGATIVE 06/11/2021 1256   HGBUR LARGE (A) 06/11/2021 1256   BILIRUBINUR NEGATIVE 06/11/2021 1256   [place] 06/11/2021 1256   PROTEINUR 30 (A) 06/11/2021 1256   NITRITE NEGATIVE 06/11/2021 1256   LEUKOCYTESUR NEGATIVE 06/11/2021 1256    Radiological Exams on Admission: CT Angio Chest PE W/Cm &/Or Wo Cm  Result Date: 06/11/2021 CLINICAL DATA:  PE suspected, high prob Near syncopal episode. EXAM:  CT ANGIOGRAPHY CHEST WITH CONTRAST TECHNIQUE: Multidetector CT imaging of the chest was performed using the standard protocol during bolus administration of intravenous contrast. Multiplanar CT image reconstructions and MIPs were obtained to evaluate the vascular anatomy. CONTRAST:  36mL OMNIPAQUE IOHEXOL 350 MG/ML SOLN COMPARISON:  None available. FINDINGS: Cardiovascular: There are no filling defects within the pulmonary arteries to suggest pulmonary embolus. The thoracic aorta is normal in caliber. Conventional branching pattern from the aortic arch. No acute aortic findings. The heart is normal in size. There is no pericardial effusion. Mediastinum/Nodes: No mediastinal or hilar adenopathy. No esophageal wall thickening. There is a 16 mm hypodense right thyroid nodule. Lungs/Pleura: Clear lungs. No focal airspace disease, pleural effusion, pulmonary edema or pneumothorax. No pulmonary nodule or mass. Trachea and central bronchi are patent. Upper Abdomen: No acute findings.  Suspected hepatic  steatosis. Musculoskeletal: There are no acute or suspicious osseous abnormalities. Review of the MIP images confirms the above findings. IMPRESSION: 1. No pulmonary embolus or acute intrathoracic abnormality. 2. Right thyroid nodule measuring 16 mm. Recommend thyroid US (ref: J Am Coll Radiol. 2015 Feb;12(2): 143-50). Electronically Signed   By: Keith Rake M.D.   On: 06/11/2021 18:38    Assessment/Plan    NSTEMI  -Suspect demand ischemia related to undetermined etiology for presyncopal event - Continue heparin infusion from the ED - Aspirin, statin and metoprolol - Nitroglycerin sublingual as needed chest pain with morphine for breakthrough - Echocardiogram - Cardiology consult    Postural dizziness with presyncope - Etiology uncertain, possibly underlying tachyarrhythmia versus NSTEMI as primary etiology - CTA negative for PE - Continuous cardiac monitoring, echocardiogram - Orthostatic vital signs, neurologic checks - IV hydration - Cardiology consult as above    Tachycardia with heart rate 121-140  - Continuous cardiac monitoring to evaluate for arrhythmias - Start metoprolol 25 twice daily - Thyroid function panel and TSH    Hypertensive urgency - BP 150/110 on arrival - Continue spironolactone and titrate/add medications to achieve control - Started on metoprolol 25 twice daily    Right thyroid nodule >1cm on CT - CTA showing right thyroid nodule 16 mm - Follow TSH and thyroid panel - Thyroid ultrasound recommended by radiology    PCOS (polycystic ovarian syndrome) - Continue Aldactone - Hold metformin for now due to contrasted study on admission, pending normal creatinine    NASH (nonalcoholic steatohepatitis)   Obesity - No acute issues    DVT prophylaxis: Lovenox  Code Status: full code  Family Communication:  none  Disposition Plan: Back to previous home environment Consults called: Cardiology Status: Observation   Athena Masse MD [facility]   06/11/2021, 7:39 PM

## 2021-06-11 NOTE — ED Provider Notes (Signed)
[facility] Emergency Department Provider Note   ____________________________________________   Event Date/Time   First MD Initiated Contact with Patient 06/11/21 1548     (approximate)  I have reviewed the triage vital signs and the nursing notes.   HISTORY  Chief Complaint Near Syncope    HPI Elizabeth Hurst is a 40 y.o. female with past medical history of hypertension, diabetes, PCOS, and NASH who presents to the ED complaining of near syncope.  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  She felt hot and sweaty along with a sensation that she was going to pass out, however she never fully lost consciousness.  She remained seated for the entire episode, states she felt tight in her chest but denies any difficulty breathing.  She had been feeling well prior to the episode and now states all symptoms have resolved and she feels back to normal.  She does state that the school nurse checked her blood pressure during the episode and found it to be low at 90s over 40s.  She has never had similar episodes in the past.        Past Medical History:  Diagnosis Date   Hypertension    Polycystic ovaries     Patient Active Problem List   Diagnosis Date Noted   Postural dizziness with presyncope 06/11/2021   NSTEMI (non-ST elevated myocardial infarction) ([place]) 06/11/2021   Tachycardia with heart rate 121-140 beats per minute 06/11/2021   Hypertensive urgency 06/11/2021   Right thyroid nodule 06/11/2021   Hypertension 04/27/2020   Obesity 04/21/2019   Allergic rhinitis, seasonal 12/16/2017   NASH (nonalcoholic steatohepatitis) 06/09/2015   Acne 06/09/2015   PCOS (polycystic ovarian syndrome) 07/06/2014    History reviewed. No pertinent surgical history.  Prior to Admission medications   Medication Sig Start Date End Date Taking? Authorizing Provider  Dapsone (ACZONE)  7.5 % GEL Apply topically in the morning to face 02/28/20  Yes Ralene Bathe, MD  Dermatological Products, Misc. (HYLATOPIC PLUS) CREA  03/07/15  Yes [provider]  doxycycline (MONODOX) 100 MG capsule TAKE 1 CAPSULE BY MOUTH ONCE DAILY WITH FOOD 04/03/21  Yes Ralene Bathe, MD  drospirenone-ethinyl estradiol (OCELLA) 3-0.03 MG tablet Take 1 tablet by mouth daily. CONTINUOUS DOSING 04/01/49  Yes Copland, Deirdre Evener, PA-C  fexofenadine (ALLEGRA) 180 MG tablet Take 180 mg by mouth daily.   Yes [provider]  metFORMIN (GLUCOPHAGE-XR) 500 MG 24 hr tablet TAKE 2 TABLETS (1,000 MG TOTAL) BY MOUTH 2 (TWO) TIMES DAILY 04/01/15  Yes [provider]  spironolactone (ALDACTONE) 100 MG tablet Take 1 tablet (100 mg total) by mouth daily. 10/04/86  Yes Copland, Deirdre Evener, PA-C  tretinoin (RETIN-A) 0.05 % cream  05/22/18  Yes [provider]    Allergies Tetanus toxoids  Family History  Problem Relation Age of Onset   Hyperlipidemia Mother    Hyperlipidemia Father    Hyperlipidemia Maternal Grandmother    Hyperlipidemia Maternal Grandfather    Hyperlipidemia Paternal Grandmother    Lymphoma Paternal Grandmother    Hyperlipidemia Paternal Grandfather    Lung cancer Paternal Grandfather    Ovarian cancer Cousin        3rd cousin    Social History Social History   Tobacco Use   Smoking status: Never   Smokeless tobacco: Never  Vaping Use   Vaping Use: Former  Substance Use  Topics   Alcohol use: Yes    Alcohol/week: 0.0 standard drinks   Drug use: No    Review of Systems  Constitutional: No fever/chills Eyes: No visual changes. ENT: No sore throat. Cardiovascular: Denies chest pain.  Positive for lightheadedness and near syncope. Respiratory: Denies shortness of breath. Gastrointestinal: No abdominal pain.  No nausea, no vomiting.  No diarrhea.  No constipation. Genitourinary: Negative for dysuria. Musculoskeletal: Negative for back pain. Skin:  Negative for rash. Neurological: Negative for headaches, focal weakness or numbness.  ____________________________________________   PHYSICAL EXAM:  VITAL SIGNS: ED Triage Vitals  Enc Vitals Group     BP 06/11/21 1257 (!) 150/110     Pulse Rate 06/11/21 1257 (!) 122     Resp 06/11/21 1257 18     Temp 06/11/21 1257 98 F (36.7 C)     Temp src --      SpO2 06/11/21 1257 98 %     Weight 06/11/21 1421 213 lb 6.5 oz (96.8 kg)     Height 06/11/21 1421 5\' 6"  (1.676 m)     Head Circumference --      Peak Flow --      Pain Score 06/11/21 1254 0     Pain Loc --      Pain Edu? --      Excl. in [place]? --     Constitutional: Alert and oriented. Eyes: Conjunctivae are normal. Head: Atraumatic. Nose: No congestion/rhinnorhea. Mouth/Throat: Mucous membranes are moist. Neck: Normal ROM Cardiovascular: Tachycardic, regular rhythm. Grossly normal heart sounds.  2+ radial pulses bilaterally. Respiratory: Normal respiratory effort.  No retractions. Lungs CTAB. Gastrointestinal: Soft and nontender. No distention. Genitourinary: deferred Musculoskeletal: No lower extremity tenderness nor edema. Neurologic:  Normal speech and language. No gross focal neurologic deficits are appreciated. Skin:  Skin is warm, dry and intact. No rash noted. Psychiatric: Mood and affect are normal. Speech and behavior are normal.  ____________________________________________   LABS (all labs ordered are listed, but only abnormal results are displayed)  Labs Reviewed  BASIC METABOLIC PANEL - Abnormal; Notable for the following components:      Result Value   Glucose, Bld 150 (*)    All other components within normal limits  CBC - Abnormal; Notable for the following components:   WBC 13.7 (*)    Hemoglobin 15.1 (*)    All other components within normal limits  URINALYSIS, ROUTINE W REFLEX MICROSCOPIC - Abnormal; Notable for the following components:   Color, Urine YELLOW (*)    APPearance HAZY (*)    Hgb  urine dipstick LARGE (*)    Protein, ur 30 (*)    Bacteria, UA FEW (*)    All other components within normal limits  TROPONIN I (HIGH SENSITIVITY) - Abnormal; Notable for the following components:   Troponin I (High Sensitivity) 25 (*)    All other components within normal limits  TROPONIN I (HIGH SENSITIVITY) - Abnormal; Notable for the following components:   Troponin I (High Sensitivity) 113 (*)    All other components within normal limits  RESP PANEL BY RT-PCR (FLU A&B, COVID) ARPGX2  PREGNANCY, URINE  D-DIMER, QUANTITATIVE  HEPARIN LEVEL (UNFRACTIONATED)  CBC  THYROID PANEL WITH TSH  POC URINE PREG, ED  CBG MONITORING, ED   ____________________________________________  EKG  ED ECG REPORT I, Blake Divine, the attending physician, personally viewed and interpreted this ECG.   Date: 06/11/2021  EKG Time: 12:55  Rate: 122  Rhythm: sinus tachycardia  Axis: Normal  Intervals:none  ST&T Change: None    PROCEDURES  Procedure(s) performed (including Critical Care):  .Critical Care Performed by: Blake Divine, MD Authorized by: Blake Divine, MD   Critical care provider statement:    Critical care time (minutes):  45   Critical care time was exclusive of:  Separately billable procedures and treating other patients and teaching time   Critical care was necessary to treat or prevent imminent or life-threatening deterioration of the following conditions:  Cardiac failure   Critical care was time spent personally by me on the following activities:  Development of treatment plan with patient or surrogate, discussions with consultants, evaluation of patient's response to treatment, examination of patient, ordering and review of laboratory studies, ordering and review of radiographic studies, ordering and performing treatments and interventions, pulse oximetry, re-evaluation of patient's condition and review of old charts   I assumed direction of critical care for this  patient from another provider in my specialty: no     Care discussed with: admitting provider     ____________________________________________   INITIAL IMPRESSION / ASSESSMENT AND PLAN / ED COURSE      40 year old female with past medical history of hypertension, diabetes, PCOS, and NASH who presents to the ED complaining of episode of lightheadedness, near syncope, and chest tightness while at work today.  Patient now reports all symptoms have resolved and she feels back to normal.  Vital signs remarkable for tachycardia but are otherwise reassuring.  EKG shows sinus tachycardia with no evidence of arrhythmia or ischemia.  We will observe on cardiac monitor, labs thus far are reassuring and we will add on troponin.  Pregnancy testing is negative and UA does not appear consistent with infection.  Overall low suspicion for cardiac etiology of her symptoms, symptoms sound more consistent with a vasovagal episode.  Troponin noted to be mildly elevated at 25, unfortunately repeat is uptrending at 113.  CTA was performed and negative for PE, given concern for ACS we will start patient on a heparin drip.  Case discussed with hospitalist for admission for further work-up of near syncope and elevated troponin.      ____________________________________________   FINAL CLINICAL IMPRESSION(S) / ED DIAGNOSES  Final diagnoses:  Near syncope  Elevated troponin     ED Discharge Orders     None        Note:  This document was prepared using Dragon voice recognition software and may include unintentional dictation errors.    Blake Divine, MD 06/11/21 1944

## 2021-06-11 NOTE — ED Triage Notes (Signed)
First Nurse Note:  C/O dizziness while at work, felt faint.  Per [facility] report, VS wnl, symptoms have resolved.

## 2021-06-11 NOTE — ED Triage Notes (Signed)
Pt comes via EMS with c/o near syncopal episode. Pt states she was at school and became clammy, hypotensive and black tunnel vision.   Pt states now all symptoms have resolved. Pt states she just feels kinda weak on her feet now.

## 2021-06-12 ENCOUNTER — Observation Stay (INDEPENDENT_AMBULATORY_CARE_PROVIDER_SITE_OTHER): Payer: BC Managed Care – PPO

## 2021-06-12 ENCOUNTER — Observation Stay (HOSPITAL_BASED_OUTPATIENT_CLINIC_OR_DEPARTMENT_OTHER)
Admit: 2021-06-12 | Discharge: 2021-06-12 | Disposition: A | Discharge status: Home / Self Care | Payer: BC Managed Care – PPO | Attending: Internal Medicine | Admitting: Internal Medicine

## 2021-06-12 ENCOUNTER — Telehealth: Payer: Self-pay | Admitting: *Deleted

## 2021-06-12 ENCOUNTER — Observation Stay: Payer: BC Managed Care – PPO

## 2021-06-12 DIAGNOSIS — R55 Syncope and collapse: Secondary | ICD-10-CM

## 2021-06-12 DIAGNOSIS — I214 Non-ST elevation (NSTEMI) myocardial infarction: Secondary | ICD-10-CM | POA: Diagnosis not present

## 2021-06-12 DIAGNOSIS — I10 Essential (primary) hypertension: Secondary | ICD-10-CM | POA: Diagnosis not present

## 2021-06-12 DIAGNOSIS — E785 Hyperlipidemia, unspecified: Secondary | ICD-10-CM | POA: Diagnosis not present

## 2021-06-12 DIAGNOSIS — R778 Other specified abnormalities of plasma proteins: Secondary | ICD-10-CM | POA: Diagnosis not present

## 2021-06-12 LAB — BASIC METABOLIC PANEL
Anion gap: 6 (ref 5–15)
BUN: 9 mg/dL (ref 6–20)
CO2: 27 mmol/L (ref 22–32)
Calcium: 8.7 mg/dL — ABNORMAL LOW (ref 8.9–10.3)
Chloride: 104 mmol/L (ref 98–111)
Creatinine, Ser: 0.79 mg/dL (ref 0.44–1.00)
GFR, Estimated: >60 mL/min (ref >60)
Glucose, Bld: 105 mg/dL — ABNORMAL HIGH (ref 70–99)
Potassium: 3.8 mmol/L (ref 3.5–5.1)
Sodium: 137 mmol/L (ref 135–145)

## 2021-06-12 LAB — TSH: TSH: 1.753 u[IU]/mL (ref 0.350–4.500)

## 2021-06-12 LAB — ECHOCARDIOGRAM COMPLETE
AR max vel: 4.22 cm2
AV Area VTI: 5.29 cm2
AV Area mean vel: 4.38 cm2
AV Mean grad: 2 mmHg
AV Peak grad: 3.9 mmHg
Ao pk vel: 0.99 m/s
Area-P 1/2: 4.19 cm2
Height: 66 in
MV VTI: 4.54 cm2
S' Lateral: 2.64 cm
Weight: 3414.48 oz

## 2021-06-12 LAB — CBC
HCT: 44.7 % (ref 36.0–46.0)
Hemoglobin: 14.6 g/dL (ref 12.0–15.0)
MCH: 30.1 pg (ref 26.0–34.0)
MCHC: 32.7 g/dL (ref 30.0–36.0)
MCV: 92.2 fL (ref 80.0–100.0)
Platelets: 335 10*3/uL (ref 150–400)
RBC: 4.85 MIL/uL (ref 3.87–5.11)
RDW: 12.4 % (ref 11.5–15.5)
WBC: 9 10*3/uL (ref 4.0–10.5)
nRBC: 0 % (ref 0.0–0.2)

## 2021-06-12 LAB — LIPID PANEL
Cholesterol: 187 mg/dL (ref 0–200)
HDL: 60 mg/dL (ref >40)
LDL Cholesterol: 98 mg/dL (ref 0–99)
Total CHOL/HDL Ratio: 3.1 RATIO
Triglycerides: 147 mg/dL (ref <150)
VLDL: 29 mg/dL (ref 0–40)

## 2021-06-12 LAB — HEMOGLOBIN A1C
Hgb A1c MFr Bld: 5.5 % (ref 4.8–5.6)
Mean Plasma Glucose: 111.15 mg/dL

## 2021-06-12 LAB — HEPARIN LEVEL (UNFRACTIONATED): Heparin Unfractionated: 0.33 IU/mL (ref 0.30–0.70)

## 2021-06-12 LAB — TROPONIN I (HIGH SENSITIVITY): Troponin I (High Sensitivity): 57 ng/L — ABNORMAL HIGH (ref <18)

## 2021-06-12 MED ORDER — ATORVASTATIN CALCIUM 20 MG PO TABS: 20.0000 mg | ORAL_TABLET | Freq: Every day | ORAL | 0 refills | Status: DC

## 2021-06-12 MED ORDER — METOPROLOL TARTRATE 25 MG PO TABS: 25.0000 mg | ORAL_TABLET | Freq: Two times a day (BID) | ORAL | 0 refills | Status: DC

## 2021-06-12 MED ORDER — ASPIRIN 81 MG PO TBEC
81.0000 mg | DELAYED_RELEASE_TABLET | Freq: Every day | ORAL | 11 refills | Status: DC
Start: 2021-06-12 — End: 2021-07-12

## 2021-06-12 NOTE — Discharge Summary (Signed)
Physician Discharge Summary  Elizabeth Hurst MVE:[id number] DOB: [birth year] DOA: 06/11/2021  PCP: Virginia Crews, MD  Admit date: 06/11/2021 Discharge date: 06/12/2021  Admitted From: Home Disposition: Home  Recommendations for Outpatient Follow-up:  Follow up with PCP in 1-2 weeks Please obtain BMP/CBC in one week Please follow up with cardiology for Holter monitor evaluation per their schedule  Home Health: None Equipment/Devices: None  Discharge Condition: Stable CODE STATUS: Full Diet recommendation: Regular diet  Brief/Interim Summary: Elizabeth Hurst is a 40 y.o. 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   Patient evaluated by cardiology, unremarkable work-up, given further discussion appears to be a vasovagal episode while she was eating versus a tachyarrhythmic episode but telemetry has been unrevealing as has her echocardiogram.  Cardiology recommending Holter monitor and outpatient follow-up which patient is agreeable for at this time.  NSTEMI ruled out, likely patient's hypertension in the setting of stressful event and vasovagal hypotension previously was the cause of her minimal elevated troponin now resolving.  Aspirin, atorvastatin, metoprolol, and spironolactone to be continued per cardiology recommendations.  Incidental thyroid nodules noted, Right - largest measures 1.9 cm. These right thyroid nodules meet criteria for 1 year follow-up per radiology recommendations.  Discharge Diagnoses:  Principal Problem:   NSTEMI (non-ST elevated myocardial infarction) ([place]) Active Problems:   PCOS (polycystic ovarian syndrome)   NASH (nonalcoholic steatohepatitis)   Obesity   Postural dizziness with presyncope    Tachycardia with heart rate 121-140 beats per minute   Hypertensive urgency   Thyroid nodule greater than or equal to 1 cm in diameter incidentally noted on imaging study  Discharge Instructions  Allergies as of 06/12/2021       Reactions   Tetanus Toxoids Rash   patient states she has a large local swelling and rash reaction to the tetnaus shot and that she was told before to take benadryl prior to recieving vaccine        Medication List     TAKE these medications    aspirin 81 MG EC tablet Take 1 tablet (81 mg total) by mouth daily. Swallow whole.   atorvastatin 20 MG tablet Commonly known as: LIPITOR Take 1 tablet (20 mg total) by mouth daily.   Dapsone 7.5 % Gel Commonly known as: Aczone Apply topically in the morning to face   doxycycline 100 MG capsule Commonly known as: MONODOX TAKE 1 CAPSULE BY MOUTH ONCE DAILY WITH FOOD   drospirenone-ethinyl estradiol 3-0.03 MG tablet Commonly known as: Ocella Take 1 tablet by mouth daily. CONTINUOUS DOSING   fexofenadine 180 MG tablet Commonly known as: ALLEGRA Take 180 mg by mouth daily.   Hylatopic Plus Crea   metFORMIN 500 MG 24 hr tablet Commonly known as: GLUCOPHAGE-XR TAKE 2 TABLETS (1,000 MG TOTAL) BY MOUTH 2 (TWO) TIMES DAILY   metoprolol tartrate 25 MG tablet Commonly known as: LOPRESSOR Take 1 tablet (25 mg total) by mouth 2 (two) times daily.   spironolactone 100 MG tablet Commonly known as: ALDACTONE Take 1 tablet (100 mg total) by mouth daily.   tretinoin 0.05 % cream Commonly known as: RETIN-A        Allergies  Allergen Reactions   Tetanus Toxoids Rash    patient states she has a large local swelling and  rash reaction to the tetnaus shot and that she was told before to take benadryl prior to recieving vaccine    Consultations: Cardiology   Procedures/Studies: CT Angio Chest PE W/Cm &/Or Wo Cm  Result Date: 06/11/2021 CLINICAL DATA:  PE suspected, high prob Near syncopal episode.  EXAM: CT ANGIOGRAPHY CHEST WITH CONTRAST TECHNIQUE: Multidetector CT imaging of the chest was performed using the standard protocol during bolus administration of intravenous contrast. Multiplanar CT image reconstructions and MIPs were obtained to evaluate the vascular anatomy. CONTRAST:  75mL OMNIPAQUE IOHEXOL 350 MG/ML SOLN COMPARISON:  None available. FINDINGS: Cardiovascular: There are no filling defects within the pulmonary arteries to suggest pulmonary embolus. The thoracic aorta is normal in caliber. Conventional branching pattern from the aortic arch. No acute aortic findings. The heart is normal in size. There is no pericardial effusion. Mediastinum/Nodes: No mediastinal or hilar adenopathy. No esophageal wall thickening. There is a 16 mm hypodense right thyroid nodule. Lungs/Pleura: Clear lungs. No focal airspace disease, pleural effusion, pulmonary edema or pneumothorax. No pulmonary nodule or mass. Trachea and central bronchi are patent. Upper Abdomen: No acute findings.  Suspected hepatic steatosis. Musculoskeletal: There are no acute or suspicious osseous abnormalities. Review of the MIP images confirms the above findings. IMPRESSION: 1. No pulmonary embolus or acute intrathoracic abnormality. 2. Right thyroid nodule measuring 16 mm. Recommend thyroid US (ref: J Am Coll Radiol. 2015 Feb;12(2): 143-50). Electronically Signed   By: Keith Rake M.D.   On: 06/11/2021 18:38   ECHOCARDIOGRAM COMPLETE  Result Date: 06/12/2021    ECHOCARDIOGRAM REPORT   Patient Name:   Elizabeth Hurst Date of Exam: 06/12/2021 Medical Rec #:  [id number]        Height:       66.0 in Accession #:    [id number]       Weight:       213.4 lb Date of Birth:  1980/10/05        BSA:          2.056 m Patient Age:    40 years         BP:           158/108 mmHg Patient Gender: F                HR:           88 bpm. Exam Location:  [facility] Procedure: 2D Echo, Color Doppler and Cardiac Doppler Indications:     Syncope R55  History:          Patient has no prior history of Echocardiogram examinations.                  Risk Factors:Hypertension.  Sonographer:     Sherrie Sport Referring Phys:  [id number] Athena Masse Diagnosing Phys: Nelva Bush MD  Sonographer Comments: Technically challenging study due to limited acoustic windows and suboptimal apical window. IMPRESSIONS  1. Left ventricular ejection fraction, by estimation, is 65 to 70%. The left ventricle has normal function. Left ventricular endocardial border not optimally defined to evaluate regional wall motion. Left ventricular diastolic parameters are consistent with Grade I diastolic dysfunction (impaired relaxation).  2. Right ventricular systolic function is normal. The right ventricular size is normal. Tricuspid regurgitation signal is inadequate for assessing PA pressure.  3. The mitral valve is normal in structure. Trivial mitral valve regurgitation. No evidence of mitral stenosis.  4. The aortic valve was not well visualized. Aortic valve regurgitation is not visualized. No aortic  stenosis is present. FINDINGS  Left Ventricle: Left ventricular ejection fraction, by estimation, is 65 to 70%. The left ventricle has normal function. Left ventricular endocardial border not optimally defined to evaluate regional wall motion. The left ventricular internal cavity size was normal in size. There is no left ventricular hypertrophy. Left ventricular diastolic parameters are consistent with Grade I diastolic dysfunction (impaired relaxation). Right Ventricle: The right ventricular size is normal. No increase in right ventricular wall thickness. Right ventricular systolic function is normal. Tricuspid regurgitation signal is inadequate for assessing PA pressure. Left Atrium: Left atrial size was normal in size. Right Atrium: Right atrial size was normal in size. Pericardium: The pericardium was not well visualized. Mitral Valve: The mitral valve is normal in structure. Trivial mitral valve  regurgitation. No evidence of mitral valve stenosis. MV peak gradient, 3.8 mmHg. The mean mitral valve gradient is 2.0 mmHg. Tricuspid Valve: The tricuspid valve is not well visualized. Tricuspid valve regurgitation is trivial. Aortic Valve: The aortic valve was not well visualized. Aortic valve regurgitation is not visualized. No aortic stenosis is present. Aortic valve mean gradient measures 2.0 mmHg. Aortic valve peak gradient measures 3.9 mmHg. Aortic valve area, by VTI measures 5.29 cm. Pulmonic Valve: The pulmonic valve was not well visualized. Pulmonic valve regurgitation is not visualized. No evidence of pulmonic stenosis. Aorta: The aortic root is normal in size and structure. Pulmonary Artery: The pulmonary artery is of normal size. Venous: The inferior vena cava was not well visualized. IAS/Shunts: The interatrial septum was not well visualized.  LEFT VENTRICLE PLAX 2D LVIDd:         4.46 cm   Diastology LVIDs:         2.64 cm   LV e' medial:    5.00 cm/s LV PW:         0.93 cm   LV E/e' medial:  15.0 LV IVS:        0.74 cm   LV e' lateral:   11.50 cm/s LVOT diam:     2.20 cm   LV E/e' lateral: 6.5 LV SV:         86 LV SV Index:   42 LVOT Area:     3.80 cm  RIGHT VENTRICLE RV Basal diam:  3.38 cm RV S prime:     18.20 cm/s LEFT ATRIUM           Index        RIGHT ATRIUM          Index LA diam:      3.30 cm 1.61 cm/m   RA Area:     9.72 cm LA Vol (A4C): 23.2 ml 11.29 ml/m  RA Volume:   17.90 ml 8.71 ml/m  AORTIC VALVE                    PULMONIC VALVE AV Area (Vmax):    4.22 cm     RVOT Peak grad: 4 mmHg AV Area (Vmean):   4.38 cm AV Area (VTI):     5.29 cm AV Vmax:           99.00 cm/s AV Vmean:          67.500 cm/s AV VTI:            0.163 m AV Peak Grad:      3.9 mmHg AV Mean Grad:      2.0 mmHg LVOT Vmax:         110.00 cm/s  LVOT Vmean:        77.700 cm/s LVOT VTI:          0.227 m LVOT/AV VTI ratio: 1.39  AORTA Ao Root diam: 3.37 cm MITRAL VALVE MV Area (PHT): 4.19 cm     SHUNTS MV Area VTI:    4.54 cm     Systemic VTI:  0.23 m MV Peak grad:  3.8 mmHg     Systemic Diam: 2.20 cm MV Mean grad:  2.0 mmHg     Pulmonic VTI:  0.137 m MV Vmax:       0.98 m/s MV Vmean:      70.2 cm/s MV Decel Time: 181 msec MV E velocity: 75.00 cm/s MV A velocity: 113.00 cm/s MV E/A ratio:  0.66 Harrell Gave End MD Electronically signed by Nelva Bush MD Signature Date/Time: 06/12/2021/1:52:33 PM    Final    US THYROID  Result Date: 06/12/2021 CLINICAL DATA:  Thyroid nodule seen on chest CT. EXAM: THYROID ULTRASOUND TECHNIQUE: Ultrasound examination of the thyroid gland and adjacent soft tissues was performed. COMPARISON:  Chest CT 06/11/2021 FINDINGS: Parenchymal Echotexture: Normal Isthmus: 0.3 cm Right lobe: 6.2 x 1.4 x 2.0 cm Left lobe: 4.5 x 1.3 x 1.6 cm _________________________________________________________ Estimated total number of nodules >/= 1 cm: 3 Number of spongiform nodules >/=  2 cm not described below (TR1): 0 Number of mixed cystic and solid nodules >/= 1.5 cm not described below (Stephen): 0 _________________________________________________________ Nodule # 1: Location: Right; Superior Maximum size: 1.0 cm; Other 2 dimensions: 0.8 x 0.9 cm Composition: solid/almost completely solid (2) Echogenicity: hypoechoic (2); slightly Shape: not taller-than-wide (0) Margins: smooth (0) Echogenic foci: none (0) ACR TI-RADS total points: 4. ACR TI-RADS risk category: TR4 (4-6 points). ACR TI-RADS recommendations: *Given size (>/= 1 - 1.4 cm) and appearance, a follow-up ultrasound in 1 year should be considered based on TI-RADS criteria. _________________________________________________________ Nodule # 2: Location: Right; Superior Maximum size: 1.4 cm; Other 2 dimensions: 0.9 x 1.4 cm Composition: solid/almost completely solid (2) Echogenicity: hypoechoic (2); slightly Shape: not taller-than-wide (0) Margins: ill-defined (0) Echogenic foci: none (0) ACR TI-RADS total points: 4. ACR TI-RADS risk category: TR4 (4-6  points). ACR TI-RADS recommendations: *Given size (>/= 1 - 1.4 cm) and appearance, a follow-up ultrasound in 1 year should be considered based on TI-RADS criteria. _________________________________________________________ Nodule # 3: Location: Right; Inferior Maximum size: 1.9 cm; Other 2 dimensions: 1.5 x 1.7 cm Composition: solid/almost completely solid (2) Echogenicity: isoechoic (1) Shape: not taller-than-wide (0) Margins: smooth (0) Echogenic foci: none (0) ACR TI-RADS total points: 3. ACR TI-RADS risk category: TR3 (3 points). ACR TI-RADS recommendations: *Given size (>/= 1.5 - 2.4 cm) and appearance, a follow-up ultrasound in 1 year should be considered based on TI-RADS criteria. _________________________________________________________ No discrete left thyroid nodules. Small lymph nodes on both sides of the neck. IMPRESSION: Right thyroid nodules, largest measures 1.9 cm. These right thyroid nodules meet criteria for 1 year follow-up. The above is in keeping with the ACR TI-RADS recommendations - J Am Coll Radiol 2017;14:587-595. Electronically Signed   By: Markus Daft M.D.   On: 06/12/2021 08:03     Subjective: No acute issues or events overnight, denies nausea vomiting diarrhea constipation headache fevers chills chest pain palpitation orthopnea or dyspnea with exertion   Discharge Exam: Vitals:   06/12/21 1000 06/12/21 1100  BP: (!) 162/126 (!) 158/108  Pulse:  88  Resp: 12 18  Temp:    SpO2:  100%   Vitals:  06/12/21 0536 06/12/21 0900 06/12/21 1000 06/12/21 1100  BP: (!) 159/107 (!) 171/124 (!) 162/126 (!) 158/108  Pulse: 82 88  88  Resp: 17 18 12 18   Temp: 97.8 F (36.6 C)     TempSrc: Oral     SpO2: 99% 100%  100%  Weight:      Height:        General: Pt is alert, awake, not in acute distress Cardiovascular: RRR, S1/S2 +, no rubs, no gallops Respiratory: CTA bilaterally, no wheezing, no rhonchi Abdominal: Soft, NT, ND, bowel sounds + Extremities: no edema, no  cyanosis    The results of significant diagnostics from this hospitalization (including imaging, microbiology, ancillary and laboratory) are listed below for reference.     Microbiology: Recent Results (from the past 240 hour(s))  Resp Panel by RT-PCR (Flu A&B, Covid) Nasopharyngeal Swab     Status: None   Collection Time: 06/11/21  6:50 PM   Specimen: Nasopharyngeal Swab; Nasopharyngeal(NP) swabs in vial transport medium  Result Value Ref Range Status   SARS Coronavirus 2 by RT PCR NEGATIVE NEGATIVE Final    Comment: (NOTE) SARS-CoV-2 target nucleic acids are NOT DETECTED.  The SARS-CoV-2 RNA is generally detectable in upper respiratory specimens during the acute phase of infection. The lowest concentration of SARS-CoV-2 viral copies this assay can detect is 138 copies/mL. A negative result does not preclude SARS-Cov-2 infection and should not be used as the sole basis for treatment or other patient management decisions. A negative result may occur with  improper specimen collection/handling, submission of specimen other than nasopharyngeal swab, presence of viral mutation(s) within the areas targeted by this assay, and inadequate number of viral copies(<138 copies/mL). A negative result must be combined with clinical observations, patient history, and epidemiological information. The expected result is Negative.  Fact Sheet for Patients:  EntrepreneurPulse.com.au  Fact Sheet for Healthcare Providers:  IncredibleEmployment.be  This test is no t yet approved or cleared by the [place] FDA and  has been authorized for detection and/or diagnosis of SARS-CoV-2 by FDA under an Emergency Use Authorization (EUA). This EUA will remain  in effect (meaning this test can be used) for the duration of the COVID-19 declaration under Section 564(b)(1) of the Act, 21 U.S.C.section 360bbb-3(b)(1), unless the authorization is terminated  or revoked  sooner.       Influenza A by PCR NEGATIVE NEGATIVE Final   Influenza B by PCR NEGATIVE NEGATIVE Final    Comment: (NOTE) The Xpert Xpress SARS-CoV-2/FLU/RSV plus assay is intended as an aid in the diagnosis of influenza from Nasopharyngeal swab specimens and should not be used as a sole basis for treatment. Nasal washings and aspirates are unacceptable for Xpert Xpress SARS-CoV-2/FLU/RSV testing.  Fact Sheet for Patients: EntrepreneurPulse.com.au  Fact Sheet for Healthcare Providers: IncredibleEmployment.be  This test is not yet approved or cleared by the [place] FDA and has been authorized for detection and/or diagnosis of SARS-CoV-2 by FDA under an Emergency Use Authorization (EUA). This EUA will remain in effect (meaning this test can be used) for the duration of the COVID-19 declaration under Section 564(b)(1) of the Act, 21 U.S.C. section 360bbb-3(b)(1), unless the authorization is terminated or revoked.  Performed at [facility], [address] [place], [place] [address]      Labs: BNP (last 3 results) No results for input(s): BNP in the last 8760 hours. Basic Metabolic Panel: Recent Labs  Lab 06/11/21 1256 06/11/21 2004 06/12/21 0640  NA 136  --  137  K 3.7  --  3.8  CL 102  --  104  CO2 25  --  27  GLUCOSE 150*  --  105*  BUN 13  --  9  CREATININE 0.86  --  0.79  CALCIUM 9.1  --  8.7*  MG  --  2.1  --    Liver Function Tests: No results for input(s): AST, ALT, ALKPHOS, BILITOT, PROT, ALBUMIN in the last 168 hours. No results for input(s): LIPASE, AMYLASE in the last 168 hours. No results for input(s): AMMONIA in the last 168 hours. CBC: Recent Labs  Lab 06/11/21 1256 06/12/21 0640  WBC 13.7* 9.0  HGB 15.1* 14.6  HCT 45.4 44.7  MCV 91.7 92.2  PLT 375 335   Cardiac Enzymes: No results for input(s): CKTOTAL, CKMB, CKMBINDEX, TROPONINI in the last 168 hours. BNP: Invalid input(s):  POCBNP CBG: No results for input(s): GLUCAP in the last 168 hours. D-Dimer Recent Labs    06/11/21 1657  DDIMER 0.32   Hgb A1c No results for input(s): HGBA1C in the last 72 hours. Lipid Profile Recent Labs    06/12/21 0640  CHOL 187  HDL 60  LDLCALC 98  TRIG 147  CHOLHDL 3.1   Thyroid function studies Recent Labs    06/12/21 0640  TSH 1.753   Anemia work up No results for input(s): VITAMINB12, FOLATE, FERRITIN, TIBC, IRON, RETICCTPCT in the last 72 hours. Urinalysis    Component Value Date/Time   COLORURINE YELLOW (A) 06/11/2021 1256   APPEARANCEUR HAZY (A) 06/11/2021 1256   LABSPEC 1.015 06/11/2021 1256   PHURINE 7.0 06/11/2021 1256   GLUCOSEU NEGATIVE 06/11/2021 1256   HGBUR LARGE (A) 06/11/2021 1256   BILIRUBINUR NEGATIVE 06/11/2021 1256   KETONESUR NEGATIVE 06/11/2021 1256   PROTEINUR 30 (A) 06/11/2021 1256   NITRITE NEGATIVE 06/11/2021 1256   LEUKOCYTESUR NEGATIVE 06/11/2021 1256   Sepsis Labs Invalid input(s): PROCALCITONIN,  WBC,  LACTICIDVEN Microbiology Recent Results (from the past 240 hour(s))  Resp Panel by RT-PCR (Flu A&B, Covid) Nasopharyngeal Swab     Status: None   Collection Time: 06/11/21  6:50 PM   Specimen: Nasopharyngeal Swab; Nasopharyngeal(NP) swabs in vial transport medium  Result Value Ref Range Status   SARS Coronavirus 2 by RT PCR NEGATIVE NEGATIVE Final    Comment: (NOTE) SARS-CoV-2 target nucleic acids are NOT DETECTED.  The SARS-CoV-2 RNA is generally detectable in upper respiratory specimens during the acute phase of infection. The lowest concentration of SARS-CoV-2 viral copies this assay can detect is 138 copies/mL. A negative result does not preclude SARS-Cov-2 infection and should not be used as the sole basis for treatment or other patient management decisions. A negative result may occur with  improper specimen collection/handling, submission of specimen other than nasopharyngeal swab, presence of viral mutation(s)  within the areas targeted by this assay, and inadequate number of viral copies(<138 copies/mL). A negative result must be combined with clinical observations, patient history, and epidemiological information. The expected result is Negative.  Fact Sheet for Patients:  EntrepreneurPulse.com.au  Fact Sheet for Healthcare Providers:  IncredibleEmployment.be  This test is no t yet approved or cleared by the [place] FDA and  has been authorized for detection and/or diagnosis of SARS-CoV-2 by FDA under an Emergency Use Authorization (EUA). This EUA will remain  in effect (meaning this test can be used) for the duration of the COVID-19 declaration under Section 564(b)(1) of the Act, 21 U.S.C.section 360bbb-3(b)(1), unless the authorization is terminated  or revoked sooner.       Influenza A by PCR NEGATIVE NEGATIVE Final   Influenza B by PCR NEGATIVE NEGATIVE Final    Comment: (NOTE) The Xpert Xpress SARS-CoV-2/FLU/RSV plus assay is intended as an aid in the diagnosis of influenza from Nasopharyngeal swab specimens and should not be used as a sole basis for treatment. Nasal washings and aspirates are unacceptable for Xpert Xpress SARS-CoV-2/FLU/RSV testing.  Fact Sheet for Patients: EntrepreneurPulse.com.au  Fact Sheet for Healthcare Providers: IncredibleEmployment.be  This test is not yet approved or cleared by the [place] FDA and has been authorized for detection and/or diagnosis of SARS-CoV-2 by FDA under an Emergency Use Authorization (EUA). This EUA will remain in effect (meaning this test can be used) for the duration of the COVID-19 declaration under Section 564(b)(1) of the Act, 21 U.S.C. section 360bbb-3(b)(1), unless the authorization is terminated or revoked.  Performed at [facility], [address], [place], [place] [address]      Time coordinating discharge: Over 30  minutes  SIGNED:   Little Ishikawa, DO [facility] 06/12/2021, 2:35 PM Pager   If 7PM-7AM, please contact night-coverage [email or web address]

## 2021-06-12 NOTE — Consult Note (Signed)
Cardiology Consultation:   Patient ID: Elizabeth Hurst MRN: [id number]; DOB: 1981-05-15  Admit date: 06/11/2021 Date of Consult: 06/12/2021  PCP:  Virginia Crews, MD   [facility] Providers Cardiologist:  Dr. Saunders Revel  Patient Profile:   Elizabeth Hurst is a 40 y.o. female with a hx of PCOS, HLD, HTN who is being seen 06/12/2021 for the evaluation of pre-syncope and elevated troponin at the request of Dr. Avon Gully.  History of Present Illness:   Ms. Clawson with no prior cardiac history. Family history positive for father who had possible stress induced MI in his 61s. No tobacco, alcohol , drug use. on medications for BP, she changed diet and eats low salt which this improved BP. Has some anxiety, no h/o panic attacks. She is a Licensed conveyancer. Does no formal activity.   The patient presented to the ER 06/11/21 for pre-syncope. Yesterday she was at school and just finished lunch. She was sitting down when she felt tunnel vision, vision went black like she went in and out. Didn't lose consciousness or pass out.  Felt lightheaded and dizzy. Felt like she was going to pass out but didn't. Nurse came over, BP was reportedly low. EMS was called. During the episodes she felt heart racing, chest tightness and shortness of breath. EMS check BP and it was high. Symptoms resolved after 20-25 minutes. Has not felt this before, maybe when she was a child after a procedure at the doctors office. Denies recent fever, chills, nausea, vomting, LLE, orthopnea, pnd. She is on her period. No recent medication changes, travel, or other changes.   In the ER BP 150/110, HR 122bpm. Labs showed K 3.7, Scr 0.86, BUN 13, Mag 2.1, Hgb 15.1, WBC 13,000, glucose 150, UA with minimal bacteria, Urine pregnancy test negative. HS trop 25>>133. D-dimer normal. EKG showed ST with heart rate 122bpm. CTA chest negative for PE or acute abnormality. Started on IV heparin and admitted.    Past Medical History:   Diagnosis Date   Hypertension    Polycystic ovaries     History reviewed. No pertinent surgical history.   Home Medications:  Prior to Admission medications   Medication Sig Start Date End Date Taking? Authorizing Provider  Dapsone (ACZONE) 7.5 % GEL Apply topically in the morning to face 02/28/20  Yes Ralene Bathe, MD  Dermatological Products, Misc. (HYLATOPIC PLUS) CREA  03/07/15  Yes [provider]  doxycycline (MONODOX) 100 MG capsule TAKE 1 CAPSULE BY MOUTH ONCE DAILY WITH FOOD 04/03/21  Yes Ralene Bathe, MD  drospirenone-ethinyl estradiol (OCELLA) 3-0.03 MG tablet Take 1 tablet by mouth daily. CONTINUOUS DOSING 02/02/40  Yes Copland, Deirdre Evener, PA-C  fexofenadine (ALLEGRA) 180 MG tablet Take 180 mg by mouth daily.   Yes [provider]  metFORMIN (GLUCOPHAGE-XR) 500 MG 24 hr tablet TAKE 2 TABLETS (1,000 MG TOTAL) BY MOUTH 2 (TWO) TIMES DAILY 04/01/15  Yes [provider]  spironolactone (ALDACTONE) 100 MG tablet Take 1 tablet (100 mg total) by mouth daily. 09/05/76  Yes Copland, Deirdre Evener, PA-C  tretinoin (RETIN-A) 0.05 % cream  05/22/18  Yes [provider]    Inpatient Medications: Scheduled Meds:  aspirin EC  81 mg Oral Daily   atorvastatin  20 mg Oral Daily   doxycycline  100 mg Oral Daily   metoprolol tartrate  25 mg Oral BID   spironolactone  100 mg Oral Daily   Continuous Infusions:  heparin 1,100 Units/hr (06/11/21 1910)  PRN Meds: acetaminophen, nitroGLYCERIN, ondansetron (ZOFRAN) IV  Allergies:    Allergies  Allergen Reactions   Tetanus Toxoids Rash    patient states she has a large local swelling and rash reaction to the tetnaus shot and that she was told before to take benadryl prior to recieving vaccine    Social History:   Social History   Socioeconomic History   Marital status: Single    Spouse name: Not on file   Number of children: Not on file   Years of education: Not on file   Highest education level: Not on  file  Occupational History   Not on file  Tobacco Use   Smoking status: Never   Smokeless tobacco: Never  Vaping Use   Vaping Use: Former  Substance and Sexual Activity   Alcohol use: Yes    Alcohol/week: 0.0 standard drinks   Drug use: No   Sexual activity: Never    Birth control/protection: Pill  Other Topics Concern   Not on file  Social History Narrative   Not on file   Social Determinants of Health   Financial Resource Strain: Not on file  Food Insecurity: Not on file  Transportation Needs: Not on file  Physical Activity: Not on file  Stress: Not on file  Social Connections: Not on file  Intimate Partner Violence: Not on file    Family History:    Family History  Problem Relation Age of Onset   Hyperlipidemia Mother    Hyperlipidemia Father    Hyperlipidemia Maternal Grandmother    Hyperlipidemia Maternal Grandfather    Hyperlipidemia Paternal Grandmother    Lymphoma Paternal Grandmother    Hyperlipidemia Paternal Grandfather    Lung cancer Paternal Grandfather    Ovarian cancer Cousin        3rd cousin     ROS:  Please see the history of present illness.   All other ROS reviewed and negative.     Physical Exam/Data:   Vitals:   06/12/21 0300 06/12/21 0400 06/12/21 0500 06/12/21 0536  BP: (!) 147/101 126/90  (!) 159/107  Pulse:    82  Resp: 16 12 18 17   Temp:    97.8 F (36.6 C)  TempSrc:    Oral  SpO2:    99%  Weight:      Height:       No intake or output data in the 24 hours ending 06/12/21 0846 Last 3 Weights 06/11/2021 03/09/2021 09/27/2020  Weight (lbs) 213 lb 6.5 oz 213 lb 6.4 oz 227 lb  Weight (kg) 96.8 kg 96.798 kg 102.967 kg     Body mass index is 34.44 kg/m.  General:  Well nourished, well developed, in no acute distress HEENT: normal Neck: no JVD Vascular: No carotid bruits; Distal pulses 2+ bilaterally Cardiac:  normal S1, S2; RRR; no murmur  Lungs:  clear to auscultation bilaterally, no wheezing, rhonchi or rales  Abd: soft,  nontender, no hepatomegaly  Ext: no edema Musculoskeletal:  No deformities, BUE and BLE strength normal and equal Skin: warm and dry  Neuro:  CNs 2-12 intact, no focal abnormalities noted Psych:  Normal affect   EKG:  The EKG was personally reviewed and demonstrates:  ST 122bpm, TWI III Telemetry:  Telemetry was personally reviewed and demonstrates:   NSR, HR 70-80s  Relevant CV Studies:  Echo ordered  Laboratory Data:  High Sensitivity Troponin:   Recent Labs  Lab 06/11/21 1256 06/11/21 1653 06/12/21 0640  TROPONINIHS 25* 113* 57*  Chemistry Recent Labs  Lab 06/11/21 1256 06/11/21 2004 06/12/21 0640  NA 136  --  137  K 3.7  --  3.8  CL 102  --  104  CO2 25  --  27  GLUCOSE 150*  --  105*  BUN 13  --  9  CREATININE 0.86  --  0.79  CALCIUM 9.1  --  8.7*  MG  --  2.1  --   GFRNONAA >60  --  >60  ANIONGAP 9  --  6    No results for input(s): PROT, ALBUMIN, AST, ALT, ALKPHOS, BILITOT in the last 168 hours. Lipids  Recent Labs  Lab 06/12/21 0640  CHOL 187  TRIG 147  HDL 60  LDLCALC 98  CHOLHDL 3.1    Hematology Recent Labs  Lab 06/11/21 1256 06/12/21 0640  WBC 13.7* 9.0  RBC 4.95 4.85  HGB 15.1* 14.6  HCT 45.4 44.7  MCV 91.7 92.2  MCH 30.5 30.1  MCHC 33.3 32.7  RDW 12.0 12.4  PLT 375 335   Thyroid No results for input(s): TSH, FREET4 in the last 168 hours.  BNPNo results for input(s): BNP, PROBNP in the last 168 hours.  DDimer  Recent Labs  Lab 06/11/21 1657  DDIMER 0.32     Radiology/Studies:  CT Angio Chest PE W/Cm &/Or Wo Cm  Result Date: 06/11/2021 CLINICAL DATA:  PE suspected, high prob Near syncopal episode. EXAM: CT ANGIOGRAPHY CHEST WITH CONTRAST TECHNIQUE: Multidetector CT imaging of the chest was performed using the standard protocol during bolus administration of intravenous contrast. Multiplanar CT image reconstructions and MIPs were obtained to evaluate the vascular anatomy. CONTRAST:  45mL OMNIPAQUE IOHEXOL 350 MG/ML SOLN  COMPARISON:  None available. FINDINGS: Cardiovascular: There are no filling defects within the pulmonary arteries to suggest pulmonary embolus. The thoracic aorta is normal in caliber. Conventional branching pattern from the aortic arch. No acute aortic findings. The heart is normal in size. There is no pericardial effusion. Mediastinum/Nodes: No mediastinal or hilar adenopathy. No esophageal wall thickening. There is a 16 mm hypodense right thyroid nodule. Lungs/Pleura: Clear lungs. No focal airspace disease, pleural effusion, pulmonary edema or pneumothorax. No pulmonary nodule or mass. Trachea and central bronchi are patent. Upper Abdomen: No acute findings.  Suspected hepatic steatosis. Musculoskeletal: There are no acute or suspicious osseous abnormalities. Review of the MIP images confirms the above findings. IMPRESSION: 1. No pulmonary embolus or acute intrathoracic abnormality. 2. Right thyroid nodule measuring 16 mm. Recommend thyroid US (ref: J Am Coll Radiol. 2015 Feb;12(2): 143-50). Electronically Signed   By: Keith Rake M.D.   On: 06/11/2021 18:38   US THYROID  Result Date: 06/12/2021 CLINICAL DATA:  Thyroid nodule seen on chest CT. EXAM: THYROID ULTRASOUND TECHNIQUE: Ultrasound examination of the thyroid gland and adjacent soft tissues was performed. COMPARISON:  Chest CT 06/11/2021 FINDINGS: Parenchymal Echotexture: Normal Isthmus: 0.3 cm Right lobe: 6.2 x 1.4 x 2.0 cm Left lobe: 4.5 x 1.3 x 1.6 cm _________________________________________________________ Estimated total number of nodules >/= 1 cm: 3 Number of spongiform nodules >/=  2 cm not described below (TR1): 0 Number of mixed cystic and solid nodules >/= 1.5 cm not described below ([place]): 0 _________________________________________________________ Nodule # 1: Location: Right; Superior Maximum size: 1.0 cm; Other 2 dimensions: 0.8 x 0.9 cm Composition: solid/almost completely solid (2) Echogenicity: hypoechoic (2); slightly Shape: not  taller-than-wide (0) Margins: smooth (0) Echogenic foci: none (0) ACR TI-RADS total points: 4. ACR TI-RADS risk category: TR4 (4-6  points). ACR TI-RADS recommendations: *Given size (>/= 1 - 1.4 cm) and appearance, a follow-up ultrasound in 1 year should be considered based on TI-RADS criteria. _________________________________________________________ Nodule # 2: Location: Right; Superior Maximum size: 1.4 cm; Other 2 dimensions: 0.9 x 1.4 cm Composition: solid/almost completely solid (2) Echogenicity: hypoechoic (2); slightly Shape: not taller-than-wide (0) Margins: ill-defined (0) Echogenic foci: none (0) ACR TI-RADS total points: 4. ACR TI-RADS risk category: TR4 (4-6 points). ACR TI-RADS recommendations: *Given size (>/= 1 - 1.4 cm) and appearance, a follow-up ultrasound in 1 year should be considered based on TI-RADS criteria. _________________________________________________________ Nodule # 3: Location: Right; Inferior Maximum size: 1.9 cm; Other 2 dimensions: 1.5 x 1.7 cm Composition: solid/almost completely solid (2) Echogenicity: isoechoic (1) Shape: not taller-than-wide (0) Margins: smooth (0) Echogenic foci: none (0) ACR TI-RADS total points: 3. ACR TI-RADS risk category: TR3 (3 points). ACR TI-RADS recommendations: *Given size (>/= 1.5 - 2.4 cm) and appearance, a follow-up ultrasound in 1 year should be considered based on TI-RADS criteria. _________________________________________________________ No discrete left thyroid nodules. Small lymph nodes on both sides of the neck. IMPRESSION: Right thyroid nodules, largest measures 1.9 cm. These right thyroid nodules meet criteria for 1 year follow-up. The above is in keeping with the ACR TI-RADS recommendations - J Am Coll Radiol 2017;14:587-595. Electronically Signed   By: Markus Daft M.D.   On: 06/12/2021 08:03     Assessment and Plan:   Pre-syncope - unclear etiology for pre-syncope, possibly vasovagal. Also reports BP was low and then high.  Tachycardic on arrival. BG normal.   - In the ER labs showed mildly elevated troponin 25>>113 started on IV heparin - CTA negative for PE - WBC 13.7 - neg pregnancy test - orthostatics ordered - IVF - Check echo - check TSH - telemetry unremarkable - BP mildly elevated>>PTA spironolactone and started on Lopressor 25mg  BID  Elevated troponin ?NSTEMI - reported chest tightness, SOB, palpitations during presyncopal episode. Outside of this no h/o exertional chest pain - HS trop 13>113 started on IV heparin - h/o MI in father in his 81s, possibly stress induced - RF include HTN, obesity, HLD, family history - lipid panel LDL 98, HDL 60, chol 187, TG 147 - check echo - further work-up pending echo - started on Aspirin 81mg  daily, Lipitor 20mg  daily, Lopressor 25mg  BID - continue to trend troponin. 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

## 2021-06-12 NOTE — Telephone Encounter (Signed)
Spoke with pt.  Reviewed instructions for Zio AT event monitor with pt.  Zio AT ordered for home enrollment (dx: pre-syncope) Emergency contact per pt added for [facility] [phone].  Pt scheduled for 1 month hospital follow up with Dr. Saunders Revel 07/12/21 at 3 PM.  Pt was given our location and contact number in the meantime. Pt has no further questions at this time.

## 2021-06-12 NOTE — Progress Notes (Signed)
*  PRELIMINARY RESULTS* Echocardiogram 2D Echocardiogram has been performed.  Sherrie Sport 06/12/2021, 1:25 PM

## 2021-06-12 NOTE — Progress Notes (Signed)
ANTICOAGULATION CONSULT NOTE  Pharmacy Consult for heparin monitoring/adjustments Indication: chest pain/ACS  Patient Measurements: Height: 5\' 6"  (167.6 cm) Weight: 96.8 kg (213 lb 6.5 oz) IBW/kg (Calculated) : 59.3 Heparin Dosing Weight: 80.9 kg  Vital Signs: Temp: 97.5 F (36.4 C) (11/15 0100) Temp Source: Oral (11/15 0100) BP: 138/103 (11/15 0100) Pulse Rate: 77 (11/15 0100)  Labs: Recent Labs    06/11/21 1256 06/11/21 1653 06/12/21 0325  HGB 15.1*  --   --   HCT 45.4  --   --   PLT 375  --   --   HEPARINUNFRC  --   --  0.33  CREATININE 0.86  --   --   TROPONINIHS 25* 113*  --      Estimated Creatinine Clearance: 103 mL/min (by C-G formula based on SCr of 0.86 mg/dL).   Medical History: Past Medical History:  Diagnosis Date   Hypertension    Polycystic ovaries     Medications: she is on no chronic anticoagulation prior to admission based a review of medical records (medication reconciliation not yet complete)  Assessment: 40 y.o. female with PMH of hypertension, diabetes, PCOS, and NASH who presents to the ED complaining of near syncope. Her initial troponin is elevated and trending up  Goal of Therapy:  Heparin level 0.3-0.7 units/ml Monitor platelets by anticoagulation protocol: Yes  1115 0325 HL 0.33, therapeutic x 1   Plan:  Continue heparin infusion at 1100 units/hr Recheck HL in 6 hrs to confirm CBC daily while on heparin.  Renda Rolls, PharmD, [facility] 06/12/2021 4:01 AM

## 2021-06-12 NOTE — Telephone Encounter (Signed)
-----   Message from [place], PA-C sent at 06/12/2021  1:59 PM EST ----- Regarding: heart monitor and f/u Patient needs Zio AT please. She is being discharged today for pre-syncope. Needs 1 month follow-up.

## 2021-06-13 LAB — THYROID PANEL WITH TSH
Free Thyroxine Index: 2.2 (ref 1.2–4.9)
T3 Uptake Ratio: 21 % — ABNORMAL LOW (ref 24–39)
T4, Total: 10.7 ug/dL (ref 4.5–12.0)
TSH: 1.88 u[IU]/mL (ref 0.450–4.500)

## 2021-06-15 DIAGNOSIS — R55 Syncope and collapse: Secondary | ICD-10-CM | POA: Diagnosis not present

## 2021-07-12 ENCOUNTER — Ambulatory Visit: Payer: BC Managed Care – PPO | Admitting: Internal Medicine

## 2021-07-12 ENCOUNTER — Other Ambulatory Visit: Payer: Self-pay

## 2021-07-12 ENCOUNTER — Encounter: Payer: Self-pay | Admitting: Internal Medicine

## 2021-07-12 VITALS — BP 145/105 | HR 98 | Ht 66.0 in | Wt 230.0 lb

## 2021-07-12 DIAGNOSIS — R778 Other specified abnormalities of plasma proteins: Secondary | ICD-10-CM | POA: Diagnosis not present

## 2021-07-12 DIAGNOSIS — R55 Syncope and collapse: Secondary | ICD-10-CM

## 2021-07-12 DIAGNOSIS — Z01812 Encounter for preprocedural laboratory examination: Secondary | ICD-10-CM

## 2021-07-12 DIAGNOSIS — I10 Essential (primary) hypertension: Secondary | ICD-10-CM | POA: Diagnosis not present

## 2021-07-12 MED ORDER — METOPROLOL TARTRATE 100 MG PO TABS: 100.0000 mg | ORAL_TABLET | Freq: Once | ORAL | 0 refills | Status: DC

## 2021-07-12 NOTE — Patient Instructions (Signed)
Medication Instructions:   Your physician recommends that you continue on your current medications as directed. Please refer to the Current Medication list given to you today.  *If you need a refill on your cardiac medications before your next appointment, please call your pharmacy*   Lab Work:  BMET today  If you have labs (blood work) drawn today and your tests are completely normal, you will receive your results only by: [place] (if you have MyChart) OR A paper copy in the mail If you have any lab test that is abnormal or we need to change your treatment, we will call you to review the results.   Testing/Procedures:   Your cardiac CT is scheduled Thursday January 26, at 8:00 AM at the below location:  [facility] [address] [place], [place] [address] [phone]  [facility], please arrive 15 mins early for check-in and test prep.  Please follow these instructions carefully (unless otherwise directed):   On the Night Before the Test:  Be sure to Drink plenty of water. Do not consume any caffeinated/decaffeinated beverages or chocolate 12 hours prior to your test. Do not take any antihistamines 12 hours prior to your test.   On the Day of the Test:  Drink plenty of water until 1 hour prior to the test. Do not eat any food 4 hours prior to the test. You may take your regular medications prior to the test.  Take metoprolol (Lopressor) two hours prior to test. FEMALES- please wear underwire-free bra if available, avoid dresses & tight clothing       After the Test: Drink plenty of water. After receiving IV contrast, you may experience a mild flushed feeling. This is normal. On occasion, you may experience a mild rash up to 24 hours after the test. This is not dangerous. If this occurs, you can take Benadryl 25 mg and increase your fluid intake. If you experience trouble breathing,  this can be serious. If it is severe call 911 IMMEDIATELY. If it is mild, please call our office. If you take any of these medications: Glipizide/Metformin, Avandament, Glucavance, please do not take 48 hours after completing test unless otherwise instructed.   For non-scheduling related questions, please contact the cardiac imaging nurse navigator should you have any questions/concerns: Marchia Bond, Cardiac Imaging Nurse Navigator Gordy Clement, Cardiac Imaging Nurse Navigator [facility] Direct Office Dial: [phone]   For scheduling needs, including cancellations and rescheduling, please call [place], [phone].    Follow-Up: At [facility], you and your health needs are our priority.  As part of our continuing mission to provide you with exceptional heart care, we have created designated Provider Care Teams.  These Care Teams include your primary Cardiologist (physician) and Advanced Practice Providers (APPs -  Physician Assistants and Nurse Practitioners) who all work together to provide you with the care you need, when you need it.  We recommend signing up for the patient portal called "MyChart".  Sign up information is provided on this After Visit Summary.  MyChart is used to connect with patients for Virtual Visits (Telemedicine).  Patients are able to view lab/test results, encounter notes, upcoming appointments, etc.  Non-urgent messages can be sent to your provider as well.   To learn more about what you can do with MyChart, go to [email or web address].    Your next appointment:   3 month(s)  The format for your next appointment:   In  Person  Provider:   You may see Dr. Harrell Gave End or one of the following Advanced Practice Providers on your designated Care Team:   Murray Hodgkins, NP Christell Faith, PA-C Cadence Kathlen Mody, [place]

## 2021-07-12 NOTE — Progress Notes (Signed)
Follow-up Outpatient Visit Date: 07/12/2021  Primary Care Provider: Virginia Crews, MD [address] [place] [address]  Chief Complaint: Follow-up near-syncope  HPI:  Elizabeth Hurst is a 40 y.o. female with history of hypertension and PCOS, who presents for follow-up of presyncope and elevated troponin.  She presented to the [facility] emergency department in mid November after an episode of sudden vision darkening and diaphoresis concerning for syncope/near-syncope.  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  Symptoms resolved after 20-25 minutes.  Work-up in the emergency department was notable for mild high-sensitivity troponin elevation of 113 (patient never had chest pain).  CTA chest was negative for PE.  No significant coronary artery calcium was present.  Echo did not show any significant abnormalities.  She was discharged with cardiac monitor, which showed rare PACs and PVCs but no significant arrhythmia.  Today, Elizabeth Hurst reports that she has been feeling better.  She reports 1 episode of lightheadedness while at school since her hospitalization in the setting of having not eaten anything for several hours.  This resolved with having a snack.  She has not passed out.  She denies chest pain, shortness of breath, palpitations, lightheadedness, and edema.  Home blood pressures are typically lower than today's reading, usually in the 115-130/85-95 range.  --------------------------------------------------------------------------------------------------  Past Medical History:  Diagnosis Date   Hypertension    Polycystic ovaries    History reviewed. No pertinent surgical history.   Current Meds  Medication Sig   Dapsone (ACZONE) 7.5 % GEL Apply topically daily as needed.   doxycycline (MONODOX) 100 MG capsule Take 100 mg by mouth daily as needed.   drospirenone-ethinyl estradiol (OCELLA) 3-0.03 MG tablet Take 1 tablet by mouth  daily. CONTINUOUS DOSING   fexofenadine (ALLEGRA) 180 MG tablet Take 180 mg by mouth daily.   metFORMIN (GLUCOPHAGE-XR) 500 MG 24 hr tablet TAKE 2 TABLETS (1,000 MG TOTAL) BY MOUTH 2 (TWO) TIMES DAILY   metoprolol tartrate (LOPRESSOR) 25 MG tablet Take 1 tablet (25 mg total) by mouth 2 (two) times daily.   spironolactone (ALDACTONE) 100 MG tablet Take 1 tablet (100 mg total) by mouth daily.    Allergies: Tetanus toxoids  Social History   Tobacco Use   Smoking status: Never   Smokeless tobacco: Never  Vaping Use   Vaping Use: Former  Substance Use Topics   Alcohol use: Yes    Comment: 1-2 glasses of wine per week   Drug use: No    Family History  Problem Relation Age of Onset   Hyperlipidemia Mother    Hyperlipidemia Father    Hyperlipidemia Maternal Grandmother    Hyperlipidemia Maternal Grandfather    Hyperlipidemia Paternal Grandmother    Lymphoma Paternal Grandmother    Hyperlipidemia Paternal Grandfather    Lung cancer Paternal Grandfather    Ovarian cancer Cousin        3rd cousin    Review of Systems: A 12-system review of systems was performed and was negative except as noted in the HPI.  --------------------------------------------------------------------------------------------------  Physical Exam: BP (!) 145/105 (BP Location: Left Arm, Patient Position: Sitting, Cuff Size: Large)    Pulse 98    Ht 5\' 6"  (1.676 m)    Wt 230 lb (104.3 kg)    SpO2 98%    BMI 37.12 kg/m   General:  NAD. Neck: No JVD or HJR. Lungs: Clear to auscultation bilaterally without wheezes or crackles. Heart: Regular rate  and rhythm without murmurs, rubs, or gallops. Abdomen: Soft, nontender, nondistended. Extremities: No lower extremity edema.  EKG: Normal sinus rhythm without abnormality.  Lab Results  Component Value Date   WBC 9.0 06/12/2021   HGB 14.6 06/12/2021   HCT 44.7 06/12/2021   MCV 92.2 06/12/2021   PLT 335 06/12/2021    Lab Results  Component Value Date   NA  137 06/12/2021   K 3.8 06/12/2021   CL 104 06/12/2021   CO2 27 06/12/2021   BUN 9 06/12/2021   CREATININE 0.79 06/12/2021   GLUCOSE 105 (H) 06/12/2021   ALT 36 (H) 03/09/2021    Lab Results  Component Value Date   CHOL 187 06/12/2021   HDL 60 06/12/2021   LDLCALC 98 06/12/2021   TRIG 147 06/12/2021   CHOLHDL 3.1 06/12/2021    --------------------------------------------------------------------------------------------------  ASSESSMENT AND PLAN: Syncope and elevated troponin: Fortunately, Elizabeth Hurst has not had any further episodes of near syncope.  Work-up in the hospital was unrevealing other than mildly elevated troponin that peaked just over 100.  Echocardiogram at the time was reassuring.  Subsequent event monitor did not show any significant arrhythmias.  Though Elizabeth Hurst has minimal risk factors for coronary artery disease, the cause of her troponin elevation has not been clarified.  We have discussed further options to evaluate this and have agreed to obtain a coronary CTA.  If this is unremarkable, I do not think that any further testing would be necessary unless symptoms recur.  Hypertension: Blood pressure mildly elevated today and borderline elevated at home as well.  We will continue her current regimen of metoprolol and spironolactone with ongoing follow-up through Dr. Jolyn Nap.  Follow-up: Return to clinic in 3 months.  Nelva Bush, MD 07/12/2021 3:21 PM

## 2021-07-13 LAB — BASIC METABOLIC PANEL
BUN/Creatinine Ratio: 15 (ref 9–23)
BUN: 11 mg/dL (ref 6–20)
CO2: 21 mmol/L (ref 20–29)
Calcium: 9.4 mg/dL (ref 8.7–10.2)
Chloride: 106 mmol/L (ref 96–106)
Creatinine, Ser: 0.75 mg/dL (ref 0.57–1.00)
Glucose: 96 mg/dL (ref 70–99)
Potassium: 4.7 mmol/L (ref 3.5–5.2)
Sodium: 141 mmol/L (ref 134–144)
eGFR: 104 mL/min/{1.73_m2} (ref >59)

## 2021-07-14 ENCOUNTER — Encounter: Payer: Self-pay | Admitting: Internal Medicine

## 2021-07-14 DIAGNOSIS — R7989 Other specified abnormal findings of blood chemistry: Secondary | ICD-10-CM | POA: Insufficient documentation

## 2021-07-14 DIAGNOSIS — R778 Other specified abnormalities of plasma proteins: Secondary | ICD-10-CM | POA: Insufficient documentation

## 2021-07-14 DIAGNOSIS — R55 Syncope and collapse: Secondary | ICD-10-CM | POA: Insufficient documentation

## 2021-07-16 ENCOUNTER — Telehealth: Payer: Self-pay | Admitting: Internal Medicine

## 2021-07-16 MED ORDER — METOPROLOL TARTRATE 25 MG PO TABS: 25.0000 mg | ORAL_TABLET | Freq: Two times a day (BID) | ORAL | 3 refills | Status: DC

## 2021-07-16 NOTE — Telephone Encounter (Signed)
Metoprolol 25 mg has never been prescribed by Dr. Saunders Revel. Please advise if refill is appropriate and if so please send to pharmacy on file requested by patient.

## 2021-07-16 NOTE — Telephone Encounter (Signed)
°*  STAT* If patient is at the pharmacy, call can be transferred to refill team.   1. Which medications need to be refilled? (please list name of each medication and dose if known) metoprolol 25 mg po BID   2. Which pharmacy/location (including street and city if local pharmacy) is medication to be sent to? One time temp pharmacy as patient currently out of town - [facility] [address] [facility]  [phone]   3. Do they need a 30 day or 90 day supply? [facility]

## 2021-08-09 ENCOUNTER — Ambulatory Visit: Payer: BC Managed Care – PPO | Admitting: Internal Medicine

## 2021-08-20 ENCOUNTER — Other Ambulatory Visit (HOSPITAL_COMMUNITY): Payer: Self-pay | Admitting: Emergency Medicine

## 2021-08-20 ENCOUNTER — Telehealth (HOSPITAL_COMMUNITY): Payer: Self-pay | Admitting: Emergency Medicine

## 2021-08-20 DIAGNOSIS — Z01812 Encounter for preprocedural laboratory examination: Secondary | ICD-10-CM

## 2021-08-20 NOTE — Telephone Encounter (Signed)
Reaching out to patient to offer assistance regarding upcoming cardiac imaging study; pt verbalizes understanding of appt date/time, parking situation and where to check in, pre-test NPO status and medications ordered, and verified current allergies; name and call back number provided for further questions should they arise Marchia Bond RN Navigator Cardiac Imaging [facility] [phone] office [phone] cell  Denies iv issues 100mg  metoprolol  Reminded to get labs Arrival [phone]

## 2021-08-21 LAB — BASIC METABOLIC PANEL
BUN/Creatinine Ratio: 19 (ref 9–23)
BUN: 14 mg/dL (ref 6–20)
CO2: 21 mmol/L (ref 20–29)
Calcium: 9 mg/dL (ref 8.7–10.2)
Chloride: 100 mmol/L (ref 96–106)
Creatinine, Ser: 0.73 mg/dL (ref 0.57–1.00)
Glucose: 87 mg/dL (ref 70–99)
Potassium: 4.5 mmol/L (ref 3.5–5.2)
Sodium: 141 mmol/L (ref 134–144)
eGFR: 107 mL/min/{1.73_m2} (ref >59)

## 2021-08-23 ENCOUNTER — Ambulatory Visit
Admission: RE | Admit: 2021-08-23 | Discharge: 2021-08-23 | Disposition: A | Discharge status: Home / Self Care | Payer: BC Managed Care – PPO | Source: Ambulatory Visit | Attending: Internal Medicine | Admitting: Internal Medicine

## 2021-08-23 ENCOUNTER — Other Ambulatory Visit: Payer: Self-pay

## 2021-08-23 DIAGNOSIS — R778 Other specified abnormalities of plasma proteins: Secondary | ICD-10-CM | POA: Insufficient documentation

## 2021-08-23 DIAGNOSIS — R55 Syncope and collapse: Secondary | ICD-10-CM | POA: Insufficient documentation

## 2021-08-23 MED ORDER — DILTIAZEM HCL 25 MG/5ML IV SOLN
10.0000 mg | Freq: Once | INTRAVENOUS | Status: AC
  Administered 2021-08-23: 10 mg via INTRAVENOUS

## 2021-08-23 MED ORDER — IOHEXOL 350 MG/ML SOLN
100.0000 mL | Freq: Once | INTRAVENOUS | Status: AC | PRN
  Administered 2021-08-23: 100 mL via INTRAVENOUS

## 2021-08-23 MED ORDER — METOPROLOL TARTRATE 5 MG/5ML IV SOLN
5.0000 mg | INTRAVENOUS | Status: DC | PRN
  Administered 2021-08-23 (×2): 10 mg via INTRAVENOUS

## 2021-08-23 MED ORDER — NITROGLYCERIN 0.4 MG SL SUBL
0.8000 mg | SUBLINGUAL_TABLET | Freq: Once | SUBLINGUAL | Status: AC
Start: 2021-08-23 — End: 2021-08-23
  Administered 2021-08-23: 0.8 mg via SUBLINGUAL

## 2021-08-23 NOTE — Progress Notes (Signed)
Patient tolerated CT well. Drank water after. Vital signs stable encourage to drink water throughout day.Reasons explained and verbalized understanding. Ambulated steady gait.  

## 2021-08-29 ENCOUNTER — Other Ambulatory Visit: Payer: Self-pay

## 2021-08-29 ENCOUNTER — Ambulatory Visit: Payer: BC Managed Care – PPO | Admitting: Dermatology

## 2021-08-29 DIAGNOSIS — D485 Neoplasm of uncertain behavior of skin: Secondary | ICD-10-CM

## 2021-08-29 DIAGNOSIS — D225 Melanocytic nevi of trunk: Secondary | ICD-10-CM | POA: Diagnosis not present

## 2021-08-29 DIAGNOSIS — L821 Other seborrheic keratosis: Secondary | ICD-10-CM

## 2021-08-29 DIAGNOSIS — D2362 Other benign neoplasm of skin of left upper limb, including shoulder: Secondary | ICD-10-CM

## 2021-08-29 DIAGNOSIS — E282 Polycystic ovarian syndrome: Secondary | ICD-10-CM | POA: Diagnosis not present

## 2021-08-29 DIAGNOSIS — D229 Melanocytic nevi, unspecified: Secondary | ICD-10-CM

## 2021-08-29 DIAGNOSIS — Z1283 Encounter for screening for malignant neoplasm of skin: Secondary | ICD-10-CM | POA: Diagnosis not present

## 2021-08-29 DIAGNOSIS — D239 Other benign neoplasm of skin, unspecified: Secondary | ICD-10-CM

## 2021-08-29 DIAGNOSIS — L700 Acne vulgaris: Secondary | ICD-10-CM | POA: Diagnosis not present

## 2021-08-29 DIAGNOSIS — D1800 Hemangioma unspecified site: Secondary | ICD-10-CM

## 2021-08-29 DIAGNOSIS — D2222 Melanocytic nevi of left ear and external auricular canal: Secondary | ICD-10-CM | POA: Diagnosis not present

## 2021-08-29 DIAGNOSIS — L814 Other melanin hyperpigmentation: Secondary | ICD-10-CM

## 2021-08-29 DIAGNOSIS — L578 Other skin changes due to chronic exposure to nonionizing radiation: Secondary | ICD-10-CM

## 2021-08-29 HISTORY — DX: Other benign neoplasm of skin, unspecified: D23.9

## 2021-08-29 MED ORDER — DOXYCYCLINE HYCLATE 100 MG PO TABS: ORAL_TABLET | ORAL | 3 refills | Status: DC

## 2021-08-29 MED ORDER — TRETINOIN 0.05 % EX CREA: TOPICAL_CREAM | CUTANEOUS | 3 refills | Status: DC

## 2021-08-29 MED ORDER — SPIRONOLACTONE 100 MG PO TABS: 100.0000 mg | ORAL_TABLET | Freq: Every day | ORAL | 3 refills | Status: DC

## 2021-08-29 MED ORDER — DAPSONE 7.5 % EX GEL: 1.0000 "application " | Freq: Every morning | CUTANEOUS | 3 refills | Status: DC

## 2021-08-29 NOTE — Patient Instructions (Signed)

## 2021-08-29 NOTE — Progress Notes (Signed)
Follow-Up Visit   Subjective  Elizabeth Hurst is a 41 y.o. female who presents for the following: Annual Exam (No personal or fhx of skin cancers. ) and Acne (Patient currently using Spironolactone 100mg  po QD. She has been out of the Doxycycline, Aczone, and Tretinoin for about 4-5 mths now. ). The patient presents for Total-Body Skin Exam (TBSE) for skin cancer screening and mole check.  The patient has spots, moles and lesions to be evaluated, some may be new or changing.  The following portions of the chart were reviewed this encounter and updated as appropriate:   Tobacco   Allergies   Meds   Problems   Med Hx   Surg Hx   Fam Hx      Review of Systems:  No other skin or systemic complaints except as noted in HPI or Assessment and Plan.  Objective  Well appearing patient in no apparent distress; mood and affect are within normal limits.  A full examination was performed including scalp, head, eyes, ears, nose, lips, neck, chest, axillae, abdomen, back, buttocks, bilateral upper extremities, bilateral lower extremities, hands, feet, fingers, toes, fingernails, and toenails. All findings within normal limits unless otherwise noted below.  Face A couple active areas of the face.   Left Ear Sup Helix 0.6 cm irregular brown macule      Mid Back Spinal 0.4 cm dark brown macule      L bicep Scar.   Assessment & Plan  Acne vulgaris (complicated by PCOS) Chronic and persistent, but improved Face  Restart: Doxycycline 100mg  po QD,  Aczone 7.5 gel QAM, and  Tretinoin 0.05% cream QHS.  Continue Spironolactone 100mg  po QD. Pt declines Isotretinoin today (again)  Topical retinoid medications like tretinoin/Retin-A, adapalene/Differin, tazarotene/Fabior, and Epiduo/Epiduo Forte can cause dryness and irritation when first started. Only apply a pea-sized amount to the entire affected area. Avoid applying it around the eyes, edges of mouth and creases at the nose. If you  experience irritation, use a good moisturizer first and/or apply the medicine less often. If you are doing well with the medicine, you can increase how often you use it until you are applying every night. Be careful with sun protection while using this medication as it can make you sensitive to the sun. This medicine should not be used by pregnant women.   Continue Spironolactone 100mg  po QD. Spironolactone can cause increased urination and cause blood pressure to decrease. Please watch for signs of lightheadedness and be cautious when changing position. It can sometimes cause breast tenderness or an irregular period in premenopausal women. It can also increase potassium. The increase in potassium usually is not a concern unless you are taking other medicines that also increase potassium, so please be sure your doctor knows all of the other medications you are taking. This medication should not be taken by pregnant women.  This medicine should also not be taken together with sulfa drugs like Bactrim (trimethoprim/sulfamethexazole).   Doxycycline should be taken with food to prevent nausea. Do not lay down for 30 minutes after taking. Be cautious with sun exposure and use good sun protection while on this medication. Pregnant women should not take this medication.   doxycycline (VIBRA-TABS) 100 MG tablet - Face Take one tab po QD with food. Related Medications Dapsone 7.5 % GEL Apply 1 application topically in the morning. tretinoin (RETIN-A) 0.05 % cream Apply a pea sized amount to the entire face QHS.  Neoplasm of uncertain behavior of skin (  2) Left Ear Sup Helix Epidermal / dermal shaving  Lesion diameter (cm):  0.6 Informed consent: discussed and consent obtained   Timeout: patient name, date of birth, surgical site, and procedure verified   Procedure prep:  Patient was prepped and draped in usual sterile fashion Prep type:  Isopropyl alcohol Anesthesia: the lesion was anesthetized in a  standard fashion   Anesthetic:  1% lidocaine w/ epinephrine 1-100,000 buffered w/ 8.4% NaHCO3 Instrument used: flexible razor blade   Hemostasis achieved with: pressure, aluminum chloride and electrodesiccation   Outcome: patient tolerated procedure well   Post-procedure details: sterile dressing applied and wound care instructions given   Dressing type: bandage and petrolatum    Specimen 1 - Surgical pathology Differential Diagnosis: D48.5 r/o dysplastic nevus  Check Margins: No  Mid Back Spinal Epidermal / dermal shaving  Lesion diameter (cm):  0.4 Informed consent: discussed and consent obtained   Timeout: patient name, date of birth, surgical site, and procedure verified   Procedure prep:  Patient was prepped and draped in usual sterile fashion Prep type:  Isopropyl alcohol Anesthesia: the lesion was anesthetized in a standard fashion   Anesthetic:  1% lidocaine w/ epinephrine 1-100,000 buffered w/ 8.4% NaHCO3 Instrument used: flexible razor blade   Hemostasis achieved with: pressure, aluminum chloride and electrodesiccation   Outcome: patient tolerated procedure well   Post-procedure details: sterile dressing applied and wound care instructions given   Dressing type: bandage and petrolatum    Specimen 2 - Surgical pathology Differential Diagnosis: D48.5 r/o dysplastic nevus  Check Margins: No  Dermatofibroma - removal site L bicep Bx proven - benign   PCOS (polycystic ovarian syndrome) Related Medications drospirenone-ethinyl estradiol (OCELLA) 3-0.03 MG tablet Take 1 tablet by mouth daily. CONTINUOUS DOSING  spironolactone (ALDACTONE) 100 MG tablet Take 1 tablet (100 mg total) by mouth daily.  Lentigines - Scattered tan macules - Due to sun exposure - Benign-appearing, observe - Recommend daily broad spectrum sunscreen SPF 30+ to sun-exposed areas, reapply every 2 hours as needed. - Call for any changes  Seborrheic Keratoses - Stuck-on, waxy, tan-brown  papules and/or plaques  - Benign-appearing - Discussed benign etiology and prognosis. - Observe - Call for any changes  Melanocytic Nevi - Tan-brown and/or pink-flesh-colored symmetric macules and papules - Benign appearing on exam today - Observation - Call clinic for new or changing moles - Recommend daily use of broad spectrum spf 30+ sunscreen to sun-exposed areas.   Hemangiomas - Red papules - Discussed benign nature - Observe - Call for any changes  Actinic Damage - Chronic condition, secondary to cumulative UV/sun exposure - diffuse scaly erythematous macules with underlying dyspigmentation - Recommend daily broad spectrum sunscreen SPF 30+ to sun-exposed areas, reapply every 2 hours as needed.  - Staying in the shade or wearing long sleeves, sun glasses (UVA+UVB protection) and wide brim hats (4-inch brim around the entire circumference of the hat) are also recommended for sun protection.  - Call for new or changing lesions.  Skin cancer screening performed today.  Return in about 1 year (around 08/29/2022) for TBSE.  Luther Redo, CMA, am acting as scribe for Sarina Ser, MD . Documentation: I have reviewed the above documentation for accuracy and completeness, and I agree with the above.  Sarina Ser, MD

## 2021-09-01 ENCOUNTER — Encounter: Payer: Self-pay | Admitting: Dermatology

## 2021-09-03 ENCOUNTER — Telehealth: Payer: Self-pay

## 2021-09-03 NOTE — Telephone Encounter (Signed)
Discussed biopsy results with pt return to the office for surgery

## 2021-09-03 NOTE — Telephone Encounter (Signed)
-----   Message from Ralene Bathe, MD sent at 09/01/2021  3:35 PM EST ----- Diagnosis 1. 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

## 2021-09-12 NOTE — Progress Notes (Signed)
Complete physical exam   Patient: Elizabeth Hurst   DOB: 09-14-1980   41 y.o. Female  MRN: [id number] Visit Date: 09/13/2021  Today's healthcare provider: [name], MD   Chief Complaint  Patient presents with   Annual Exam   Subjective    Elizabeth Hurst is a 41 y.o. female who presents today for a complete physical exam.  She reports consuming a general diet. The patient does not participate in regular exercise at present. She generally feels well. She reports sleeping well. She does have additional problems to discuss today.  HPI  Patient does want to inform provider of hospital stay in November. Patient was treated for elevated BP and started on metoprolol tartrate 20m BID. Patient is f/b cardiology. Patient reports highest BP in November was 178/125.   Past Medical History:  Diagnosis Date   Dysplastic nevus 08/29/2021   left ear sup helix, mod   Dysplastic nevus 08/29/2021   mid back spinal-mod to severe, schedule surgery   Hypertension    Hypertensive urgency 06/11/2021   Polycystic ovaries    History reviewed. No pertinent surgical history. Social History   Socioeconomic History   Marital status: Single    Spouse name: Not on file   Number of children: Not on file   Years of education: Not on file   Highest education level: Not on file  Occupational History   Not on file  Tobacco Use   Smoking status: Never   Smokeless tobacco: Never  Vaping Use   Vaping Use: Former  Substance and Sexual Activity   Alcohol use: Yes    Comment: 1-2 glasses of wine per week   Drug use: No   Sexual activity: Never    Birth control/protection: Pill  Other Topics Concern   Not on file  Social History Narrative   Not on file   Social Determinants of Health   Financial Resource Strain: Not on file  Food Insecurity: Not on file  Transportation Needs: Not on file  Physical Activity: Not on file  Stress: Not on file  Social Connections: Not on file   Intimate Partner Violence: Not on file   Family Status  Relation Name Status   Mother  Alive   Father  Alive   MGM  Alive   MGF  Deceased   PGM  Deceased   PGF  Deceased   Cousin Maternal Deceased   Family History  Problem Relation Age of Onset   Hyperlipidemia Mother    Hyperlipidemia Father    Hyperlipidemia Maternal Grandmother    Hyperlipidemia Maternal Grandfather    Hyperlipidemia Paternal Grandmother    Lymphoma Paternal Grandmother    Hyperlipidemia Paternal Grandfather    Lung cancer Paternal Grandfather    Ovarian cancer Cousin        3rd cousin   Allergies  Allergen Reactions   Tetanus Toxoids Rash    patient states she has a large local swelling and rash reaction to the tetnaus shot and that she was told before to take benadryl prior to recieving vaccine    Patient Care Team: BBrita Romp ADionne Bucy MD as PCP - General (Family Medicine) End, CHarrell Gave MD as PCP - Cardiology (Cardiology)   Medications: Outpatient Medications Prior to Visit  Medication Sig   Dapsone 7.5 % GEL Apply 1 application topically in the morning.   doxycycline (VIBRA-TABS) 100 MG tablet Take one tab po QD with food.   drospirenone-ethinyl estradiol (OCELLA) 3-0.03 MG tablet Take  1 tablet by mouth daily. CONTINUOUS DOSING   fexofenadine (ALLEGRA) 180 MG tablet Take 180 mg by mouth daily.   metFORMIN (GLUCOPHAGE-XR) 500 MG 24 hr tablet TAKE 2 TABLETS (1,000 MG TOTAL) BY MOUTH 2 (TWO) TIMES DAILY   metoprolol tartrate (LOPRESSOR) 25 MG tablet Take 1 tablet (25 mg total) by mouth 2 (two) times daily.   spironolactone (ALDACTONE) 100 MG tablet Take 1 tablet (100 mg total) by mouth daily.   tretinoin (RETIN-A) 0.05 % cream Apply a pea sized amount to the entire face QHS.   [DISCONTINUED] doxycycline (MONODOX) 100 MG capsule Take 100 mg by mouth daily as needed. (Patient not taking: Reported on 08/29/2021)   No facility-administered medications prior to visit.    Review of Systems   Psychiatric/Behavioral:  The patient is nervous/anxious.   All other systems reviewed and are negative.  Last CBC Lab Results  Component Value Date   WBC 9.0 06/12/2021   HGB 14.6 06/12/2021   HCT 44.7 06/12/2021   MCV 92.2 06/12/2021   MCH 30.1 06/12/2021   RDW 12.4 06/12/2021   PLT 335 20/94/7096   Last metabolic panel Lab Results  Component Value Date   GLUCOSE 87 08/20/2021   NA 141 08/20/2021   K 4.5 08/20/2021   CL 100 08/20/2021   CO2 21 08/20/2021   BUN 14 08/20/2021   CREATININE 0.73 08/20/2021   EGFR 107 08/20/2021   CALCIUM 9.0 08/20/2021   PROT 7.0 03/09/2021   ALBUMIN 4.3 03/09/2021   LABGLOB 2.7 03/09/2021   AGRATIO 1.6 03/09/2021   BILITOT 0.2 03/09/2021   ALKPHOS 49 03/09/2021   AST 28 03/09/2021   ALT 36 (H) 03/09/2021   ANIONGAP 6 06/12/2021   Last lipids Lab Results  Component Value Date   CHOL 187 06/12/2021   HDL 60 06/12/2021   LDLCALC 98 06/12/2021   TRIG 147 06/12/2021   CHOLHDL 3.1 06/12/2021   Last hemoglobin A1c Lab Results  Component Value Date   HGBA1C 5.5 06/12/2021   Last thyroid functions Lab Results  Component Value Date   TSH 1.880 06/12/2021   TSH 1.753 06/12/2021   T4TOTAL 10.7 06/12/2021     Objective    BP (!) 133/93 (BP Location: Left Arm, Patient Position: Sitting, Cuff Size: Large)    Pulse 90    Temp 98.3 F (36.8 C) (Temporal)    Resp 16    Ht $R'5\' 6"'wi$  (1.676 m)    Wt 235 lb (106.6 kg)    LMP 08/23/2021 (Exact Date)    BMI 37.93 kg/m  BP Readings from Last 3 Encounters:  09/13/21 (!) 133/93  08/23/21 (!) 138/103  07/12/21 (!) 145/105   Wt Readings from Last 3 Encounters:  09/13/21 235 lb (106.6 kg)  07/12/21 230 lb (104.3 kg)  06/11/21 213 lb 6.5 oz (96.8 kg)      Physical Exam Vitals reviewed.  Constitutional:      General: She is not in acute distress.    Appearance: Normal appearance. She is well-developed. She is not diaphoretic.  HENT:     Head: Normocephalic and atraumatic.     Right Ear:  Tympanic membrane, ear canal and external ear normal.     Left Ear: Tympanic membrane, ear canal and external ear normal.     Nose: Nose normal.     Mouth/Throat:     Mouth: Mucous membranes are moist.     Pharynx: Oropharynx is clear. No oropharyngeal exudate.  Eyes:     General:  No scleral icterus.    Conjunctiva/sclera: Conjunctivae normal.     Pupils: Pupils are equal, round, and reactive to light.  Neck:     Thyroid: No thyromegaly.  Cardiovascular:     Rate and Rhythm: Normal rate and regular rhythm.     Pulses: Normal pulses.     Heart sounds: Normal heart sounds. No murmur heard. Pulmonary:     Effort: Pulmonary effort is normal. No respiratory distress.     Breath sounds: Normal breath sounds. No wheezing or rales.  Abdominal:     General: There is no distension.     Palpations: Abdomen is soft.     Tenderness: There is no abdominal tenderness.  Musculoskeletal:        General: No deformity.     Cervical back: Neck supple.     Right lower leg: No edema.     Left lower leg: No edema.  Lymphadenopathy:     Cervical: No cervical adenopathy.  Skin:    General: Skin is warm and dry.     Findings: No rash.  Neurological:     Mental Status: She is alert and oriented to person, place, and time. Mental status is at baseline.     Sensory: No sensory deficit.     Motor: No weakness.     Gait: Gait normal.  Psychiatric:        Mood and Affect: Mood normal.        Behavior: Behavior normal.        Thought Content: Thought content normal.      Last depression screening scores PHQ 2/9 Scores 09/13/2021 03/09/2021 08/01/2020  PHQ - 2 Score 0 0 0  PHQ- 9 Score 0 0 0   Last fall risk screening Fall Risk  09/13/2021  Falls in the past year? 0  Number falls in past yr: 0  Injury with Fall? 0  Risk for fall due to : No Fall Risks  Follow up Falls evaluation completed   Last Audit-C alcohol use screening Alcohol Use Disorder Test (AUDIT) 09/13/2021  1. How often do you have a  drink containing alcohol? 2  2. How many drinks containing alcohol do you have on a typical day when you are drinking? 0  3. How often do you have six or more drinks on one occasion? 0  AUDIT-C Score 2  4. How often during the last year have you found that you were not able to stop drinking once you had started? -  5. How often during the last year have you failed to do what was normally expected from you because of drinking? -  6. How often during the last year have you needed a first drink in the morning to get yourself going after a heavy drinking session? -  7. How often during the last year have you had a feeling of guilt of remorse after drinking? -  8. How often during the last year have you been unable to remember what happened the night before because you had been drinking? -  9. Have you or someone else been injured as a result of your drinking? -  10. Has a relative or friend or a doctor or another health worker been concerned about your drinking or suggested you cut down? -  Alcohol Use Disorder Identification Test Final Score (AUDIT) -  Alcohol Brief Interventions/Follow-up -   A score of 3 or more in women, and 4 or more in men indicates increased risk for  alcohol abuse, EXCEPT if all of the points are from question 1   No results found for any visits on 09/13/21.  Assessment & Plan    Routine Health Maintenance and Physical Exam  Exercise Activities and Dietary recommendations  Goals   None     Immunization History  Administered Date(s) Administered   Hepatitis A, Adult 03/10/2013, 09/14/2013   Influenza Inj Mdck Quad Pf 06/12/2017, 05/20/2018   Influenza,inj,Quad PF,6+ Mos 06/15/2016, 04/23/2019, 04/27/2020   Influenza-Unspecified 04/06/2021   Moderna Covid Bivalent Peds Booster(3moThru 573yr 04/06/2021   Moderna Sars-Covid-2 Vaccination 09/23/2019, 10/21/2019, 05/28/2020   Tdap 12/02/2005    Health Maintenance  Topic Date Due   PAP SMEAR-Modifier  09/16/2023    INFLUENZA VACCINE  Completed   COVID-19 Vaccine  Completed   Hepatitis C Screening  Completed   HIV Screening  Completed   HPV VACCINES  Aged Out    Discussed health benefits of physical activity, and encouraged her to engage in regular exercise appropriate for her age and condition.  Problem List Items Addressed This Visit       Cardiovascular and Mediastinum   Essential hypertension    Previously well controlled Slightly elevated today No changes to meds Monitor home meds and f/u in 74m97month     Other   Obesity    Discussed importance of healthy weight management Discussed diet and exercise       GAD (generalized anxiety disorder)    New diagnosis Discussed medication and therapy options Will persue therapy first and discussed options Can always reassess for meds in the future      Other Visit Diagnoses     Encounter for annual physical exam    -  Primary        Return in about 6 months (around 03/13/2022) for chronic disease f/u.     I, AngLavon PaganiniD, have reviewed all documentation for this visit. The documentation on 09/13/21 for the exam, diagnosis, procedures, and orders are all accurate and complete.   Brennen Gardiner, AngDionne BucyD, MPH [facility]

## 2021-09-13 ENCOUNTER — Other Ambulatory Visit: Payer: Self-pay

## 2021-09-13 ENCOUNTER — Ambulatory Visit (INDEPENDENT_AMBULATORY_CARE_PROVIDER_SITE_OTHER): Payer: BC Managed Care – PPO | Admitting: Family Medicine

## 2021-09-13 ENCOUNTER — Encounter: Payer: Self-pay | Admitting: Family Medicine

## 2021-09-13 VITALS — BP 133/93 | HR 90 | Temp 98.3°F | Resp 16 | Ht 66.0 in | Wt 235.0 lb

## 2021-09-13 DIAGNOSIS — Z6837 Body mass index (BMI) 37.0-37.9, adult: Secondary | ICD-10-CM

## 2021-09-13 DIAGNOSIS — I10 Essential (primary) hypertension: Secondary | ICD-10-CM

## 2021-09-13 DIAGNOSIS — Z0000 Encounter for general adult medical examination without abnormal findings: Secondary | ICD-10-CM | POA: Diagnosis not present

## 2021-09-13 DIAGNOSIS — F411 Generalized anxiety disorder: Secondary | ICD-10-CM

## 2021-09-13 NOTE — Assessment & Plan Note (Signed)
Previously well controlled Slightly elevated today No changes to meds Monitor home meds and f/u in 56month

## 2021-09-13 NOTE — Assessment & Plan Note (Signed)
Discussed importance of healthy weight management Discussed diet and exercise  

## 2021-09-13 NOTE — Assessment & Plan Note (Addendum)
New diagnosis Discussed medication and therapy options Will persue therapy first and discussed options Can always reassess for meds in the future

## 2021-09-13 NOTE — Patient Instructions (Signed)
Check into therapy - [email or web address]

## 2021-10-16 ENCOUNTER — Other Ambulatory Visit: Payer: Self-pay | Admitting: Dermatology

## 2021-10-16 ENCOUNTER — Other Ambulatory Visit: Payer: Self-pay

## 2021-10-16 ENCOUNTER — Ambulatory Visit: Payer: BC Managed Care – PPO | Admitting: Dermatology

## 2021-10-16 DIAGNOSIS — D239 Other benign neoplasm of skin, unspecified: Secondary | ICD-10-CM

## 2021-10-16 DIAGNOSIS — D235 Other benign neoplasm of skin of trunk: Secondary | ICD-10-CM

## 2021-10-16 MED ORDER — MUPIROCIN 2 % EX OINT: 1.0000 "application " | TOPICAL_OINTMENT | Freq: Every day | CUTANEOUS | 0 refills | Status: DC

## 2021-10-16 NOTE — Progress Notes (Signed)
? ?  Follow-Up Visit ?  ?Subjective  ?Elizabeth Hurst is a 41 y.o. female who presents for the following: Procedure (Moderate-severe dysplastic nevus of mid back spinal - excise today). ? ?The following portions of the chart were reviewed this encounter and updated as appropriate:  ? Tobacco  Allergies  Meds  Problems  Med Hx  Surg Hx  Fam Hx   ?  ?Review of Systems:  No other skin or systemic complaints except as noted in HPI or Assessment and Plan. ? ?Objective  ?Well appearing patient in no apparent distress; mood and affect are within normal limits. ? ?A focused examination was performed including the trunk. Relevant physical exam findings are noted in the Assessment and Plan. ? ?Mid Back Spinal ?Healing biopsy site 0.7 cm ? ? ? ?Assessment & Plan  ?Dysplastic nevus ?Mid Back Spinal ? ?Skin excision - Mid Back Spinal ? ?Lesion length (cm):  0.7 ?Lesion width (cm):  0.5 ?Margin per side (cm):  0.2 ?Total excision diameter (cm):  1.1 ?Informed consent: discussed and consent obtained   ?Timeout: patient name, date of birth, surgical site, and procedure verified   ?Procedure prep:  Patient was prepped and draped in usual sterile fashion ?Prep type:  Isopropyl alcohol and povidone-iodine ?Anesthesia: the lesion was anesthetized in a standard fashion   ?Anesthesia comment:  Lidocaine 6 cc, bupivocaine 3 cc ?Anesthetic:  1% lidocaine w/ epinephrine 1-100,000 buffered w/ 8.4% NaHCO3 ?Hemostasis achieved with: pressure   ?Hemostasis achieved with comment:  Electrocautery ?Outcome: patient tolerated procedure well with no complications   ?Post-procedure details: sterile dressing applied and wound care instructions given   ?Dressing type: bandage and pressure dressing   ? ?Skin repair - Mid Back Spinal ?Complexity:  Complex ?Final length (cm):  3 ?Informed consent: discussed and consent obtained   ?Timeout: patient name, date of birth, surgical site, and procedure verified   ?Procedure prep:  Patient was prepped and  draped in usual sterile fashion ?Prep type:  Povidone-iodine ?Anesthesia: the lesion was anesthetized in a standard fashion   ?Reason for type of repair: reduce tension to allow closure, reduce the risk of dehiscence, infection, and necrosis, reduce subcutaneous dead space and avoid a hematoma, allow closure of the large defect, preserve normal anatomy, preserve normal anatomical and functional relationships and enhance both functionality and cosmetic results   ?Undermining: edges undermined   ?Undermining comment:  0.9 cm undermining ?Subcutaneous layers (deep stitches):  ?Suture type: Vicryl (polyglactin 910)   ?Fine/surface layer approximation (top stitches):  ?Stitches: simple running   ?Suture removal (days):  7 ?Hemostasis achieved with: suture and pressure ?Outcome: patient tolerated procedure well with no complications   ?Post-procedure details: sterile dressing applied and wound care instructions given   ?Dressing type: bandage and pressure dressing   ? ?mupirocin ointment (BACTROBAN) 2 % - Mid Back Spinal ?Apply 1 application. topically daily. With dressing changes ? ?Specimen 1 - Surgical pathology ?Differential Diagnosis: D48.5 Biopsy proven moderate-severe dysplastic nevus ?Check Margins: Yes ?[id number] ? ? ?Return in about 1 week (around 10/23/2021) for suture removal . ? ?IRudell Cobb, CMA, am acting as scribe for Sarina Ser, MD . ?Documentation: I have reviewed the above documentation for accuracy and completeness, and I agree with the above. ? ?Sarina Ser, MD ? ?

## 2021-10-16 NOTE — Patient Instructions (Signed)
Wound Care Instructions ? ?On the day following your surgery, you should begin doing daily dressing changes: ?Remove the old dressing and discard it. ?Cleanse the wound gently with tap water. This may be done in the shower or by placing a wet gauze pad directly on the wound and letting it soak for several minutes. ?It is important to gently remove any dried blood from the wound in order to encourage healing. This may be done by gently rolling a moistened Q-tip on the dried blood. Do not pick at the wound. ?If the wound should start to bleed, continue cleaning the wound, then place a moist gauze pad on the wound and hold pressure for a few minutes.  ?Make sure you then dry the skin surrounding the wound completely or the tape will not stick to the skin. Do not use cotton balls on the wound. ?After the wound is clean and dry, apply the ointment gently with a Q-tip. ?Cut a non-stick pad to fit the size of the wound. Lay the pad flush to the wound. If the wound is draining, you may want to reinforce it with a small amount of gauze on top of the non-stick pad for a little added compression to the area. ?Use the tape to seal the area completely. ?Select from the following with respect to your individual situation: ?If your wound has been stitched closed: continue the above steps 1-8 at least daily until your sutures are removed. ?If your wound has been left open to heal: continue steps 1-8 at least daily for the first 3-4 weeks. ?We would like for you to take a few extra precautions for at least the next week. ?Sleep with your head elevated on pillows if our wound is on your head. ?Do not bend over or lift heavy items to reduce the chance of elevated blood pressure to the wound ?Do not participate in particularly strenuous activities. ? ? ?Below is a list of dressing supplies you might need.  ?Cotton-tipped applicators - Q-tips ?Gauze pads (2x2 and/or 4x4) - All-Purpose Sponges ?Non-stick dressing material - Telfa ?Tape -  Paper or Hypafix ?New and clean tube of petroleum jelly - Vaseline  ? ? ?Comments on Post-Operative Period ?Slight swelling and redness often appear around the wound. This is normal and will disappear within several days following the surgery. ?The healing wound will drain a brownish-red-yellow discharge during healing. This is a normal phase of wound healing. As the wound begins to heal, the drainage may increase in amount. Again, this drainage is normal. ?Notify us if the drainage becomes persistently bloody, excessively swollen, or intensely painful or develops a foul odor or red streaks.  ?If you should experience mild discomfort during the healing phase, you may take an aspirin-free medication such as Tylenol (acetaminophen). Notify us if the discomfort is severe or persistent. Avoid alcoholic beverages when taking pain medicine. ? ?In Case of Wound Hemorrhage ?A wound hemorrhage is when the bandage suddenly becomes soaked with bright red blood and flows profusely. If this happens, sit down or lie down with your head elevated. If the wound has a dressing on it, do not remove the dressing. Apply pressure to the existing gauze. If the wound is not covered, use a gauze pad to apply pressure and continue applying the pressure for 20 minutes without peeking. DO NOT COVER THE WOUND WITH A LARGE TOWEL OR WASH CLOTH. Release your hand from the wound site but do not remove the dressing. If the bleeding has stopped,   gently clean around the wound. Leave the dressing in place for 24 hours if possible. This wait time allows the blood vessels to close off so that you do not spark a new round of bleeding by disrupting the newly clotted blood vessels with an immediate dressing change. If the bleeding does not subside, continue to hold pressure. If matters are out of your control, contact an After Hours clinic or go to the Emergency Room. ? ?If You Need Anything After Your Visit ? ?If you have any questions or concerns for your  doctor, please call our main line at [phone] and press option 4 to reach your doctor's medical assistant. If no one answers, please leave a voicemail as directed and we will return your call as soon as possible. Messages left after 4 pm will be answered the following business day.  ? ?You may also send us a message via MyChart. We typically respond to MyChart messages within 1-2 business days. ? ?For prescription refills, please ask your pharmacy to contact our office. Our fax number is [phone]. ? ?If you have an urgent issue when the clinic is closed that cannot wait until the next business day, you can page your doctor at the number below.   ? ?Please note that while we do our best to be available for urgent issues outside of office hours, we are not available 24/7.  ? ?If you have an urgent issue and are unable to reach us, you may choose to seek medical care at your doctor's office, retail clinic, urgent care center, or emergency room. ? ?If you have a medical emergency, please immediately call 911 or go to the emergency department. ? ?Pager Numbers ? ?- Dr. Kowalski: [phone] ? ?- Dr. Moye: [phone] ? ?- Dr. Stewart: [phone] ? ?In the event of inclement weather, please call our main line at [phone] for an update on the status of any delays or closures. ? ?Dermatology Medication Tips: ?Please keep the boxes that topical medications come in in order to help keep track of the instructions about where and how to use these. Pharmacies typically print the medication instructions only on the boxes and not directly on the medication tubes.  ? ?If your medication is too expensive, please contact our office at [phone] option 4 or send us a message through MyChart.  ? ?We are unable to tell what your co-pay for medications will be in advance as this is different depending on your insurance coverage. However, we may be able to find a substitute medication at lower cost or fill out paperwork  to get insurance to cover a needed medication.  ? ?If a prior authorization is required to get your medication covered by your insurance company, please allow us 1-2 business days to complete this process. ? ?Drug prices often vary depending on where the prescription is filled and some pharmacies may offer cheaper prices. ? ?The website www.goodrx.com contains coupons for medications through different pharmacies. The prices here do not account for what the cost may be with help from insurance (it may be cheaper with your insurance), but the website can give you the price if you did not use any insurance.  ?- You can print the associated coupon and take it with your prescription to the pharmacy.  ?- You may also stop by our office during regular business hours and pick up a GoodRx coupon card.  ?- If you need your prescription sent electronically to a different pharmacy, notify our office through    MyChart or by phone at [phone] option 4. ? ? ? ? ?Si Usted Necesita Algo Despu?s de Su Visita ? ?Tambi?n puede enviarnos un mensaje a trav?s de MyChart. Por lo general respondemos a los mensajes de MyChart en el transcurso de 1 a 2 d?as h?biles. ? ?Para renovar recetas, por favor pida a su farmacia que se ponga en contacto con nuestra oficina. Nuestro n?mero de fax es el [phone]. ? ?Si tiene un asunto urgente cuando la cl?nica est? cerrada y que no puede esperar hasta el siguiente d?a h?bil, puede llamar/localizar a su doctor(a) al n?mero que aparece a continuaci?n.  ? ?Por favor, tenga en cuenta que aunque hacemos todo lo posible para estar disponibles para asuntos urgentes fuera del horario de oficina, no estamos disponibles las 24 horas del d?a, los 7 d?as de la semana.  ? ?Si tiene un problema urgente y no puede comunicarse con nosotros, puede optar por buscar atenci?n m?dica  en el consultorio de su doctor(a), en una cl?nica privada, en un centro de atenci?n urgente o en una sala de  emergencias. ? ?Si tiene una emergencia m?dica, por favor llame inmediatamente al 911 o vaya a la sala de emergencias. ? ?N?meros de b?per ? ?- Dr. Kowalski: [phone] ? ?- Dra. Moye: [phone] ? ?- Dra. St

## 2021-10-19 ENCOUNTER — Other Ambulatory Visit: Payer: Self-pay

## 2021-10-19 ENCOUNTER — Ambulatory Visit: Payer: BC Managed Care – PPO | Admitting: Medical

## 2021-10-19 ENCOUNTER — Encounter: Payer: Self-pay | Admitting: Medical

## 2021-10-19 VITALS — BP 130/100 | HR 89 | Ht 66.0 in | Wt 235.4 lb

## 2021-10-19 DIAGNOSIS — I10 Essential (primary) hypertension: Secondary | ICD-10-CM

## 2021-10-19 DIAGNOSIS — R55 Syncope and collapse: Secondary | ICD-10-CM | POA: Diagnosis not present

## 2021-10-19 NOTE — Patient Instructions (Signed)
Medication Instructions:  ? ?Your physician recommends that you continue on your current medications as directed. Please refer to the Current Medication list given to you today.  ? ?*If you need a refill on your cardiac medications before your next appointment, please call your pharmacy* ? ? ?Lab Work: ?None ordered ? ?If you have labs (blood work) drawn today and your tests are completely normal, you will receive your results only by: ?MyChart Message (if you have MyChart) OR ?A paper copy in the mail ?If you have any lab test that is abnormal or we need to change your treatment, we will call you to review the results. ? ? ?Testing/Procedures: ?None ordered ? ? ?Follow-Up: ?At [facility], you and your health needs are our priority.  As part of our continuing mission to provide you with exceptional heart care, we have created designated Provider Care Teams.  These Care Teams include your primary Cardiologist (physician) and Advanced Practice Providers (APPs -  Physician Assistants and Nurse Practitioners) who all work together to provide you with the care you need, when you need it. ? ?We recommend signing up for the patient portal called "MyChart".  Sign up information is provided on this After Visit Summary.  MyChart is used to connect with patients for Virtual Visits (Telemedicine).  Patients are able to view lab/test results, encounter notes, upcoming appointments, etc.  Non-urgent messages can be sent to your provider as well.   ?To learn more about what you can do with MyChart, go to [email or web address].   ? ?Your next appointment:   ?As needed ? ?The format for your next appointment:   ?In Person ? ?Provider:   ?You may see Nelva Bush, MD or one of the following Advanced Practice Providers on your designated Care Team:   ?Murray Hodgkins, NP ?Christell Faith, PA-C ?Cadence Kathlen Mody, PA-C ? ? ?Other Instructions ?N/A ?

## 2021-10-19 NOTE — Progress Notes (Signed)
? ?I,Sulibeya S Dimas,acting as a scribe for [name], MD.,have documented all relevant documentation on the behalf of [name], MD,as directed by  [name], MD while in the presence of [name], MD. ? ? ?MyChart Video Visit ? ? ? ?Virtual Visit via Video Note  ? ?This visit type was conducted due to national recommendations for restrictions regarding the COVID-19 Pandemic (e.g. social distancing) in an effort to limit this patient's exposure and mitigate transmission in our community. This patient is at least at moderate risk for complications without adequate follow up. This format is felt to be most appropriate for this patient at this time. Physical exam was limited by quality of the video and audio technology used for the visit.  ? ? ?Patient location: home ?Provider location: [facility] ?Persons involved in the visit: patient, provider  ? ?I discussed the limitations of evaluation and management by telemedicine and the availability of in person appointments. The patient expressed understanding and agreed to proceed. ? ?Patient: Elizabeth Hurst   DOB: 1981/02/14   41 y.o. Female  MRN: [id number] ?Visit Date: 10/22/2021 ? ?Today's healthcare provider: [name], MD  ? ?Chief Complaint  ?Patient presents with  ? Hypertension  ? ?Subjective  ?  ?HPI  ?Hypertension, follow-up ? ?BP Readings from Last 3 Encounters:  ?10/22/21 119/89  ?10/19/21 (!) 130/100  ?09/13/21 (!) 133/93  ? Wt Readings from Last 3 Encounters:  ?10/19/21 235 lb 6 oz (106.8 kg)  ?09/13/21 235 lb (106.6 kg)  ?07/12/21 230 lb (104.3 kg)  ?  ? ?She was last seen for hypertension 1 months ago.  ?BP at that visit was 133/93. Management since that visit includes No changes to meds ?Monitor home meds and f/u in 49month. ? ?She reports excellent compliance with treatment. ?She is not having side effects.  ?She is following a Low Sodium diet. ?She is not exercising. ? ?Does have less stress  now. ? ? ?Outside blood pressures are 120/80s.  Has had some 130s/100s but they are less common. ? ? ?Symptoms: ?No chest pain No chest pressure  ?No palpitations No syncope  ?No dyspnea No orthopnea  ?No paroxysmal nocturnal dyspnea No lower extremity edema  ? ?Pertinent labs: ?Lab Results  ?Component Value Date  ? CHOL 187 06/12/2021  ? HDL 60 06/12/2021  ? [place] 98 06/12/2021  ? TRIG 147 06/12/2021  ? CHOLHDL 3.1 06/12/2021  ? Lab Results  ?Component Value Date  ? NA 141 08/20/2021  ? K 4.5 08/20/2021  ? CREATININE 0.73 08/20/2021  ? EGFR 107 08/20/2021  ? GLUCOSE 87 08/20/2021  ? TSH 1.880 06/12/2021  ? TSH 1.753 06/12/2021  ?  ? ?The ASCVD Risk score (Arnett DK, et al., 2019) failed to calculate for the following reasons: ?  The 2019 ASCVD risk score is only valid for ages 2 to 31 ?  The patient has a prior MI or stroke diagnosis  ? ?---------------------------------------------------------------------------------------------------  ? ? ?Medications: ?Outpatient Medications Prior to Visit  ?Medication Sig  ? Dapsone 7.5 % GEL Apply 1 application topically in the morning.  ? doxycycline (VIBRA-TABS) 100 MG tablet Take one tab po QD with food.  ? drospirenone-ethinyl estradiol (OCELLA) 3-0.03 MG tablet Take 1 tablet by mouth daily. CONTINUOUS DOSING  ? fexofenadine (ALLEGRA) 180 MG tablet Take 180 mg by mouth daily.  ? metFORMIN (GLUCOPHAGE-XR) 500 MG 24 hr tablet TAKE 2 TABLETS (1,000 MG TOTAL) BY MOUTH 2 (TWO) TIMES DAILY  ? metoprolol tartrate (LOPRESSOR)  25 MG tablet Take 1 tablet (25 mg total) by mouth 2 (two) times daily.  ? mupirocin ointment (BACTROBAN) 2 % Apply 1 application. topically daily. With dressing changes  ? spironolactone (ALDACTONE) 100 MG tablet Take 1 tablet (100 mg total) by mouth daily.  ? tretinoin (RETIN-A) 0.05 % cream Apply a pea sized amount to the entire face QHS.  ? ?No facility-administered medications prior to visit.  ? ? ?Review of Systems  ?Constitutional:  Negative for  appetite change and fatigue.  ?Respiratory:  Negative for chest tightness and shortness of breath.   ?Cardiovascular:  Negative for chest pain, palpitations and leg swelling.  ? ?  ? Objective  ?  ?BP 119/89  ?BP Readings from Last 3 Encounters:  ?10/22/21 119/89  ?10/19/21 (!) 130/100  ?09/13/21 (!) 133/93  ? ?Wt Readings from Last 3 Encounters:  ?10/19/21 235 lb 6 oz (106.8 kg)  ?09/13/21 235 lb (106.6 kg)  ?07/12/21 230 lb (104.3 kg)  ? ?  ? ?Physical Exam ?Constitutional:   ?   Appearance: Normal appearance.  ?Pulmonary:  ?   Effort: Pulmonary effort is normal. No respiratory distress.  ?Neurological:  ?   Mental Status: She is alert and oriented to person, place, and time. Mental status is at baseline.  ?  ? ? ? Assessment & Plan  ?  ? ?Problem List Items Addressed This Visit   ? ?  ? Cardiovascular and Mediastinum  ? Essential hypertension - Primary  ?  Well controlled, but borderline for goal based on some other recent home BPs ?Continue current medications ?May consider addition of amlodipine at next visit if elevated ?Encourage DASH diet and exercise ?Reviewed recent metabolic panel ?  ?  ?  ? ?Return in about 2 months (around 12/22/2021) for BP f/u, virtual ok.  ?  ? ?I discussed the assessment and treatment plan with the patient. The patient was provided an opportunity to ask questions and all were answered. The patient agreed with the plan and demonstrated an understanding of the instructions. ?  ?The patient was advised to call back or seek an in-person evaluation if the symptoms worsen or if the condition fails to improve as anticipated. ? ?I, [name], MD, have reviewed all documentation for this visit. The documentation on 10/22/21 for the exam, diagnosis, procedures, and orders are all accurate and complete. ? ? ?Virginia Crews, MD, MPH ?[place] ?[facility]    ?

## 2021-10-19 NOTE — Progress Notes (Signed)
?Cardiology Office Note:   ? ?Date:  10/19/2021  ? ?ID:  NYIAH PIANKA, DOB 13-Mar-1981, MRN [id number] ? ?PCP:  Virginia Crews, MD  ?[facility] Cardiologist:  Nelva Bush, MD  ?[facility] Electrophysiologist:  None  ? ?Referring MD: Virginia Crews, MD  ? ?Chief Complaint: 3 month f/u ? ?History of Present Illness:   ? ?Elizabeth Hurst is a 41 y.o. female with a hx of hypertension and PCOS, who presents for follow-up of presyncope and elevated troponin.  She presented to the [facility] emergency department in mid November after an episode of sudden vision darkening and diaphoresis concerning for syncope/near-syncope.  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  Symptoms resolved after 20-25 minutes.  Work-up in the emergency department was notable for mild high-sensitivity troponin elevation of 113 (patient never had chest pain).  CTA chest was negative for PE.  No significant coronary artery calcium was present.  Echo did not show any significant abnormalities.  She was discharged with cardiac monitor, which showed rare PACs and PVCs but no significant arrhythmia.  ? ?Seen in the office 07/12/21 and was overall feeling better. Reported 1 episode of lightheadedness in the setting of not eating. CTA coronary was ordered for elevated troponin. This showed calcium score of 0, no CAD.  ? ?Today, the patient denies further episode of lightheadedness. No dizziness, chest pain, SOB, palpitations, LLE, orthopnea, pnd. BP is a little high, PCP will follow this. Has not done a lot of activity, needs to get back to walking.  ? ?Past Medical History:  ?Diagnosis Date  ? Dysplastic nevus 08/29/2021  ? left ear sup helix, mod  ? Dysplastic nevus 08/29/2021  ? mid back spinal-mod to severe, schedule surgery  ? Hypertension   ? Hypertensive urgency 06/11/2021  ? Polycystic ovaries   ? ? ?Past Surgical History:  ?Procedure Laterality Date  ? SKIN CANCER EXCISION    ? Non Cancerous   ? ? ?Current Medications: ?Current Meds  ?Medication Sig  ? Dapsone 7.5 % GEL Apply 1 application topically in the morning.  ? doxycycline (VIBRA-TABS) 100 MG tablet Take one tab po QD with food.  ? drospirenone-ethinyl estradiol (OCELLA) 3-0.03 MG tablet Take 1 tablet by mouth daily. CONTINUOUS DOSING  ? fexofenadine (ALLEGRA) 180 MG tablet Take 180 mg by mouth daily.  ? metFORMIN (GLUCOPHAGE-XR) 500 MG 24 hr tablet TAKE 2 TABLETS (1,000 MG TOTAL) BY MOUTH 2 (TWO) TIMES DAILY  ? metoprolol tartrate (LOPRESSOR) 25 MG tablet Take 1 tablet (25 mg total) by mouth 2 (two) times daily.  ? mupirocin ointment (BACTROBAN) 2 % Apply 1 application. topically daily. With dressing changes  ? spironolactone (ALDACTONE) 100 MG tablet Take 1 tablet (100 mg total) by mouth daily.  ? tretinoin (RETIN-A) 0.05 % cream Apply a pea sized amount to the entire face QHS.  ?  ? ?Allergies:   Tetanus toxoids  ? ?Social History  ? ?Socioeconomic History  ? Marital status: Single  ?  Spouse name: Not on file  ? Number of children: Not on file  ? Years of education: Not on file  ? Highest education level: Not on file  ?Occupational History  ? Not on file  ?Tobacco Use  ? Smoking status: Never  ? Smokeless tobacco: Never  ?Vaping Use  ? Vaping Use: Former  ?Substance and Sexual Activity  ? Alcohol use: Yes  ?  Comment: 1-2 glasses of wine per week  ?  Drug use: No  ? Sexual activity: Never  ?  Birth control/protection: Pill  ?Other Topics Concern  ? Not on file  ?Social History Narrative  ? Not on file  ? ?Social Determinants of Health  ? ?Financial Resource Strain: Not on file  ?Food Insecurity: Not on file  ?Transportation Needs: Not on file  ?Physical Activity: Not on file  ?Stress: Not on file  ?Social Connections: Not on file  ?  ? ?Family History: ?The patient's family history includes Hyperlipidemia in her father, maternal grandfather, maternal grandmother, mother, paternal grandfather, and paternal grandmother; Lung cancer in her  paternal grandfather; Lymphoma in her paternal grandmother; Ovarian cancer in her cousin. ? ?ROS:   ?Please see the history of present illness.    ? All other systems reviewed and are negative. ? ?EKGs/Labs/Other Studies Reviewed:   ? ?The following studies were reviewed today: ? ?Coronary CTA 07/2021 ?  ?IMPRESSION: ?1. Normal coronary calcium score of 0. Patient is a low risk for ?coronary events. ?  ?2. Normal coronary origin with right dominance. ?  ?3. No evidence of CAD. ?  ?4. CAD-RADS 0. Consider non-atherosclerotic causes of chest pain. ?  ?Electronically Signed: ?By: Kate Sable M.D. ?On: 08/23/2021 12:51 ? ?Heart monitor 05/2021 ?The patient was monitored for 13 days, 23 hours. ?The predominant rhythm was sinus with an average rate of 88 bpm (range 56-148 bpm). ?There were rare PACs and PVCs. ?No sustained arrhythmia or prolonged pause was observed. ?Single diary event corresponds to sinus rhythm. ?  ?Predominantly sinus rhythm with rare PACs and PVCs.  No significant arrhythmia observed. ? ?Echo 05/2021 ? ? 1. Left ventricular ejection fraction, by estimation, is 65 to 70%. The  ?left ventricle has normal function. Left ventricular endocardial border  ?not optimally defined to evaluate regional wall motion. Left ventricular  ?diastolic parameters are consistent  ?with Grade I diastolic dysfunction (impaired relaxation).  ? 2. Right ventricular systolic function is normal. The right ventricular  ?size is normal. Tricuspid regurgitation signal is inadequate for assessing  ?PA pressure.  ? 3. The mitral valve is normal in structure. Trivial mitral valve  ?regurgitation. No evidence of mitral stenosis.  ? 4. The aortic valve was not well visualized. Aortic valve regurgitation  ?is not visualized. No aortic stenosis is present.  ? ?EKG:  EKG is  ordered today.  The ekg ordered today demonstrates NSR, 89bpm, nonspecific T wave changes ? ?Recent Labs: ?03/09/2021: ALT 36 ?06/11/2021: Magnesium  2.1 ?06/12/2021: Hemoglobin 14.6; Platelets 335; TSH 1.880; TSH 1.753 ?08/20/2021: BUN 14; Creatinine, Ser 0.73; Potassium 4.5; Sodium 141  ?Recent Lipid Panel ?   ?Component Value Date/Time  ? CHOL 187 06/12/2021 0640  ? CHOL 210 (H) 03/09/2021 1055  ? TRIG 147 06/12/2021 0640  ? HDL 60 06/12/2021 0640  ? HDL 70 03/09/2021 1055  ? CHOLHDL 3.1 06/12/2021 0640  ? VLDL 29 06/12/2021 0640  ? [place] 98 06/12/2021 0640  ? LDLCALC 122 (H) 03/09/2021 1055  ? ? ?Physical Exam:   ? ?VS:  BP (!) 130/100 (BP Location: Left Arm, Patient Position: Sitting, Cuff Size: Normal)   Pulse 89   Ht '5\' 6"'$  (1.676 m)   Wt 235 lb 6 oz (106.8 kg)   SpO2 99%   BMI 37.99 kg/m?    ? ?Wt Readings from Last 3 Encounters:  ?10/19/21 235 lb 6 oz (106.8 kg)  ?09/13/21 235 lb (106.6 kg)  ?07/12/21 230 lb (104.3 kg)  ?  ? ?GEN:  Well nourished, well developed in no acute distress ?HEENT: Normal ?NECK: No JVD; No carotid bruits ?LYMPHATICS: No lymphadenopathy ?CARDIAC: RRR, no murmurs, rubs, gallops ?RESPIRATORY:  Clear to auscultation without rales, wheezing or rhonchi  ?ABDOMEN: Soft, non-tender, non-distended ?MUSCULOSKELETAL:  No edema; No deformity  ?SKIN: Warm and dry ?NEUROLOGIC:  Alert and oriented x 3 ?PSYCHIATRIC:  Normal affect  ? ?ASSESSMENT:   ? ?1. Syncope, unspecified syncope type   ?2. Essential hypertension   ? ?PLAN:   ? ?In order of problems listed above: ? ?Syncope ?So far cardiac work-up has been ubnremarkable. Echo in the hospital was reassuring. Cardiac CT (for elevated troponin) was reviewed, which showed no CAD and a calcium score of 0.  She denies further syncopal episodes. No further work-up indicated at this time.  ? ?HTN ?BP mildly elevated, PCP will follow  this.  ? ?Disposition: Follow up prn with MD/APP  ? ? ? ?Signed, ?Ayan Yankey Ninfa Meeker, PA-C  ?10/19/2021 4:10 PM    ?[place] ? ?

## 2021-10-21 ENCOUNTER — Encounter: Payer: Self-pay | Admitting: Dermatology

## 2021-10-22 ENCOUNTER — Other Ambulatory Visit: Payer: Self-pay

## 2021-10-22 ENCOUNTER — Encounter: Payer: Self-pay | Admitting: Family Medicine

## 2021-10-22 ENCOUNTER — Telehealth (INDEPENDENT_AMBULATORY_CARE_PROVIDER_SITE_OTHER): Payer: BC Managed Care – PPO | Admitting: Family Medicine

## 2021-10-22 VITALS — BP 119/89

## 2021-10-22 DIAGNOSIS — I10 Essential (primary) hypertension: Secondary | ICD-10-CM | POA: Diagnosis not present

## 2021-10-22 NOTE — Assessment & Plan Note (Signed)
Well controlled, but borderline for goal based on some other recent home BPs ?Continue current medications ?May consider addition of amlodipine at next visit if elevated ?Encourage DASH diet and exercise ?Reviewed recent metabolic panel ?

## 2021-10-23 ENCOUNTER — Other Ambulatory Visit: Payer: Self-pay

## 2021-10-23 ENCOUNTER — Encounter: Payer: Self-pay | Admitting: Dermatology

## 2021-10-23 ENCOUNTER — Ambulatory Visit (INDEPENDENT_AMBULATORY_CARE_PROVIDER_SITE_OTHER): Payer: BC Managed Care – PPO | Admitting: Dermatology

## 2021-10-23 DIAGNOSIS — D239 Other benign neoplasm of skin, unspecified: Secondary | ICD-10-CM

## 2021-10-23 DIAGNOSIS — Z4802 Encounter for removal of sutures: Secondary | ICD-10-CM

## 2021-10-23 DIAGNOSIS — D235 Other benign neoplasm of skin of trunk: Secondary | ICD-10-CM

## 2021-10-23 NOTE — Progress Notes (Signed)
? ?  Follow-Up Visit ?  ?Subjective  ?Elizabeth Hurst is a 41 y.o. female who presents for the following: Dysplastic nevus margins free, bx proven (Mid back spinal, pt presents for suture removal). ? ?The following portions of the chart were reviewed this encounter and updated as appropriate:  ? Tobacco  Allergies  Meds  Problems  Med Hx  Surg Hx  Fam Hx   ?  ?Review of Systems:  No other skin or systemic complaints except as noted in HPI or Assessment and Plan. ? ?Objective  ?Well appearing patient in no apparent distress; mood and affect are within normal limits. ? ?A focused examination was performed including back. Relevant physical exam findings are noted in the Assessment and Plan. ? ?Mid Back Spinal ?Healing excision site ? ? ?Assessment & Plan  ?Severe dysplastic nevus -that is post excision margins free ?Mid Back Spinal ? ?Margins free, bx proven ? ?Encounter for Removal of Sutures ?- Incision site at the mid back spinal is clean, dry and intact ?- Wound cleansed, sutures removed, wound cleansed and steri strips applied.  ?- Discussed pathology results showing Dysplastic nevus margins free  ?- Patient advised to keep steri-strips dry until they fall off. ?- Scars remodel for a full year. ?- Once steri-strips fall off, patient can apply over-the-counter silicone scar cream each night to help with scar remodeling if desired. ?- Patient advised to call with any concerns or if they notice any new or changing lesions.  ? ?Related Medications ?mupirocin ointment (BACTROBAN) 2 % ?Apply 1 application. topically daily. With dressing changes ? ? ?Return for as scheduled for TBSE. ? ?I, Othelia Pulling, RMA, am acting as scribe for Sarina Ser, MD . ?Documentation: I have reviewed the above documentation for accuracy and completeness, and I agree with the above. ? ?Sarina Ser, MD ? ?

## 2021-10-23 NOTE — Patient Instructions (Signed)

## 2021-10-25 ENCOUNTER — Encounter: Payer: Self-pay | Admitting: Dermatology

## 2021-11-21 ENCOUNTER — Encounter: Payer: Self-pay | Admitting: Obstetrics and Gynecology

## 2021-11-21 ENCOUNTER — Ambulatory Visit (INDEPENDENT_AMBULATORY_CARE_PROVIDER_SITE_OTHER): Payer: BC Managed Care – PPO | Admitting: Obstetrics and Gynecology

## 2021-11-21 VITALS — BP 128/90 | Ht 66.0 in | Wt 235.0 lb

## 2021-11-21 DIAGNOSIS — Z01419 Encounter for gynecological examination (general) (routine) without abnormal findings: Secondary | ICD-10-CM

## 2021-11-21 DIAGNOSIS — Z3041 Encounter for surveillance of contraceptive pills: Secondary | ICD-10-CM | POA: Diagnosis not present

## 2021-11-21 DIAGNOSIS — E282 Polycystic ovarian syndrome: Secondary | ICD-10-CM | POA: Diagnosis not present

## 2021-11-21 MED ORDER — SPIRONOLACTONE 100 MG PO TABS: 100.0000 mg | ORAL_TABLET | Freq: Every day | ORAL | 3 refills | Status: DC

## 2021-11-21 MED ORDER — DROSPIRENONE-ETHINYL ESTRADIOL 3-0.03 MG PO TABS: 1.0000 | ORAL_TABLET | Freq: Every day | ORAL | 4 refills | Status: DC

## 2021-11-21 NOTE — Progress Notes (Signed)
? ?PCP:  Virginia Crews, MD ? ? ?Chief Complaint  ?Patient presents with  ? Gynecologic Exam  ?  Last cycle pt spotted for 2 weeks, first time ever, no abnormal pain  ? ? ? ?HPI: ?     Ms. LU PARADISE is a 41 y.o. No obstetric history on file. who LMP was Patient's last menstrual period was 10/16/2021 (exact date)., presents today for her annual examination.  Her menses are Q3 months with cont dosing OCPs, lasting 3-5 days, used to be 4-7 days. Dysmenorrhea mild, occurring first 1-2 days of flow. She occas has very light intermenstrual bleeding. Had 2 wks light spotting after LMP on new pack of pills, unusual for pt. Bleeding has since stopped. She is on OCPs for PCOS. Also was followed by endocrine and takes metformin and spironolactone for PCOS. Needs spironolactone RF. PCP does metformin. ? ?Sex activity: never sexually active.  ?Last Pap: 09/15/18  Results were: no abnormalities /neg HPV DNA  ?Hx of STDs: none ? ?There is no FH of breast cancer. There is no FH of ovarian cancer. The patient does not do self-breast exams.  ? ?Tobacco use: The patient denies current or previous tobacco use. ?Alcohol use: social ?No drug use.  ?Exercise: not active ? ?She does get adequate calcium but not Vitamin D in her diet. ?Labs with PCP ? ?Past Medical History:  ?Diagnosis Date  ? Dysplastic nevus 08/29/2021  ? left ear sup helix, mod  ? Dysplastic nevus 08/29/2021  ? mid back spinal-mod to severe, Excised 10/16/21  ? Hypertension   ? Hypertensive urgency 06/11/2021  ? Polycystic ovaries   ? ? ?Past Surgical History:  ?Procedure Laterality Date  ? SKIN CANCER EXCISION    ? Non Cancerous  ? ? ?Family History  ?Problem Relation Age of Onset  ? Hyperlipidemia Mother   ? Hyperlipidemia Father   ? Hyperlipidemia Maternal Grandmother   ? Hyperlipidemia Maternal Grandfather   ? Hyperlipidemia Paternal Grandmother   ? Lymphoma Paternal Grandmother   ? Hyperlipidemia Paternal Grandfather   ? Lung cancer Paternal Grandfather    ? Ovarian cancer Cousin   ?     3rd cousin  ? ? ?Social History  ? ?Socioeconomic History  ? Marital status: Single  ?  Spouse name: Not on file  ? Number of children: Not on file  ? Years of education: Not on file  ? Highest education level: Not on file  ?Occupational History  ? Not on file  ?Tobacco Use  ? Smoking status: Never  ? Smokeless tobacco: Never  ?Vaping Use  ? Vaping Use: Former  ?Substance and Sexual Activity  ? Alcohol use: Yes  ?  Comment: 1-2 glasses of wine per week  ? Drug use: No  ? Sexual activity: Never  ?  Birth control/protection: Pill  ?Other Topics Concern  ? Not on file  ?Social History Narrative  ? Not on file  ? ?Social Determinants of Health  ? ?Financial Resource Strain: Not on file  ?Food Insecurity: Not on file  ?Transportation Needs: Not on file  ?Physical Activity: Not on file  ?Stress: Not on file  ?Social Connections: Not on file  ?Intimate Partner Violence: Not on file  ? ? ?Current Meds  ?Medication Sig  ? Dapsone 7.5 % GEL Apply 1 application topically in the morning.  ? doxycycline (VIBRA-TABS) 100 MG tablet Take one tab po QD with food.  ? fexofenadine (ALLEGRA) 180 MG tablet Take 180 mg by  mouth daily.  ? metFORMIN (GLUCOPHAGE-XR) 500 MG 24 hr tablet TAKE 2 TABLETS (1,000 MG TOTAL) BY MOUTH 2 (TWO) TIMES DAILY  ? metoprolol tartrate (LOPRESSOR) 25 MG tablet Take 1 tablet (25 mg total) by mouth 2 (two) times daily.  ? mupirocin ointment (BACTROBAN) 2 % Apply 1 application. topically daily. With dressing changes  ? tretinoin (RETIN-A) 0.05 % cream Apply a pea sized amount to the entire face QHS.  ? [DISCONTINUED] drospirenone-ethinyl estradiol (OCELLA) 3-0.03 MG tablet Take 1 tablet by mouth daily. CONTINUOUS DOSING  ? [DISCONTINUED] spironolactone (ALDACTONE) 100 MG tablet Take 1 tablet (100 mg total) by mouth daily.  ? ? ? ?ROS: ? ?Review of Systems  ?Constitutional:  Negative for fatigue, fever and unexpected weight change.  ?Respiratory:  Negative for cough, shortness  of breath and wheezing.   ?Cardiovascular:  Negative for chest pain, palpitations and leg swelling.  ?Gastrointestinal:  Negative for blood in stool, constipation, diarrhea, nausea and vomiting.  ?Endocrine: Negative for cold intolerance, heat intolerance and polyuria.  ?Genitourinary:  Negative for dyspareunia, dysuria, flank pain, frequency, genital sores, hematuria, menstrual problem, pelvic pain, urgency, vaginal bleeding, vaginal discharge and vaginal pain.  ?Musculoskeletal:  Negative for back pain, joint swelling and myalgias.  ?Skin:  Negative for rash.  ?Neurological:  Negative for dizziness, syncope, light-headedness, numbness and headaches.  ?Hematological:  Negative for adenopathy.  ?Psychiatric/Behavioral:  Negative for agitation, confusion, sleep disturbance and suicidal ideas. The patient is not nervous/anxious.   ? ?Objective: ?BP 128/90   Ht '5\' 6"'$  (1.676 m)   Wt 235 lb (106.6 kg)   LMP 10/16/2021 (Exact Date)   BMI 37.93 kg/m?  ? ? ?Physical Exam ?Constitutional:   ?   Appearance: She is well-developed.  ?Genitourinary:  ?   Vulva normal.  ?   Right Labia: No rash, tenderness or lesions. ?   Left Labia: No tenderness, lesions or rash. ?   No vaginal discharge, erythema or tenderness.  ? ?   Right Adnexa: not tender and no mass present. ?   Left Adnexa: not tender and no mass present. ?   No cervical friability or polyp.  ?   Uterus is not enlarged or tender.  ?Breasts: ?   Right: No mass, nipple discharge, skin change or tenderness.  ?   Left: No mass, nipple discharge, skin change or tenderness.  ?Neck:  ?   Thyroid: No thyromegaly.  ?Cardiovascular:  ?   Rate and Rhythm: Normal rate and regular rhythm.  ?   Heart sounds: Normal heart sounds. No murmur heard. ?Pulmonary:  ?   Effort: Pulmonary effort is normal.  ?   Breath sounds: Normal breath sounds.  ?Abdominal:  ?   Palpations: Abdomen is soft.  ?   Tenderness: There is no abdominal tenderness. There is no guarding or rebound.   ?Musculoskeletal:     ?   General: Normal range of motion.  ?   Cervical back: Normal range of motion.  ?Lymphadenopathy:  ?   Cervical: No cervical adenopathy.  ?Neurological:  ?   General: No focal deficit present.  ?   Mental Status: She is alert and oriented to person, place, and time.  ?   Cranial Nerves: No cranial nerve deficit.  ?Skin: ?   General: Skin is warm and dry.  ?Psychiatric:     ?   Mood and Affect: Mood normal.     ?   Behavior: Behavior normal.     ?   Thought  Content: Thought content normal.     ?   Judgment: Judgment normal.  ?Vitals reviewed.  ? ? ?Assessment/Plan: ?Encounter for annual routine gynecological examination ? ?Encounter for surveillance of contraceptive pills - Plan: drospirenone-ethinyl estradiol (OCELLA) 3-0.03 MG tablet; OCP RF ? ?PCOS (polycystic ovarian syndrome) - Plan: spironolactone (ALDACTONE) 100 MG tablet, drospirenone-ethinyl estradiol (OCELLA) 3-0.03 MG tablet; Rx RF spironolactone and OCPs. Does metformin with PCP ? ? ?Meds ordered this encounter  ?Medications  ? spironolactone (ALDACTONE) 100 MG tablet  ?  Sig: Take 1 tablet (100 mg total) by mouth daily.  ?  Dispense:  90 tablet  ?  Refill:  3  ?  Order Specific Question:   Supervising Provider  ?  Answer:   CONSTANT, PEGGY [[id number]]  ? drospirenone-ethinyl estradiol (OCELLA) 3-0.03 MG tablet  ?  Sig: Take 1 tablet by mouth daily. CONTINUOUS DOSING  ?  Dispense:  84 tablet  ?  Refill:  4  ?  Order Specific Question:   Supervising Provider  ?  Answer:   CONSTANT, PEGGY [[id number]]  ? ?          ?GYN counsel adequate intake of calcium and vitamin D, diet and exercise ? ? ?  F/U ? Return in about 1 year (around 11/22/2022). ? ?Owynn Mosqueda B. Ernesteen Mihalic, PA-C ?11/21/2021 ?4:07 PM ?

## 2021-11-21 NOTE — Patient Instructions (Signed)
I value your feedback and you entrusting us with your care. If you get a [facility] patient survey, I would appreciate you taking the time to let us know about your experience today. Thank you! ? ? ?

## 2021-12-28 NOTE — Progress Notes (Signed)
Established patient visit  I,Elizabeth Hurst,acting as a scribe for [name], MD.,have documented all relevant documentation on the behalf of [name], MD,as directed by  [name], MD while in the presence of [name], MD.   Patient: Elizabeth Hurst   DOB: [birth year]   41 y.o. Female  MRN: [id number] Visit Date: 12/31/2021  Today's healthcare provider: [name], MD   Chief Complaint  Patient presents with   follow-up BP   Subjective    HPI  Hypertension, follow-up  BP Readings from Last 3 Encounters:  12/31/21 116/88  11/21/21 128/90  10/22/21 119/89   Wt Readings from Last 3 Encounters:  12/31/21 234 lb 3.2 oz (106.2 kg)  11/21/21 235 lb (106.6 kg)  10/19/21 235 lb 6 oz (106.8 kg)     She was last seen for hypertension 2 months ago.  BP at that visit was 119/89. Management since that visit includes encouraged DASH diet and exercise.  She is following a Low Sodium, and low calorie  diet. She is exercising. She does not smoke.  Use of agents associated with hypertension: none.   Outside blood pressures are 116-120/80's. Symptoms: No chest pain No chest pressure  No palpitations No syncope  No dyspnea No orthopnea  No paroxysmal nocturnal dyspnea No lower extremity edema   Pertinent labs Lab Results  Component Value Date   CHOL 187 06/12/2021   HDL 60 06/12/2021   LDLCALC 98 06/12/2021   TRIG 147 06/12/2021   CHOLHDL 3.1 06/12/2021   Lab Results  Component Value Date   NA 141 08/20/2021   K 4.5 08/20/2021   CREATININE 0.73 08/20/2021   EGFR 107 08/20/2021   GLUCOSE 87 08/20/2021   TSH 1.880 06/12/2021   TSH 1.753 06/12/2021     The ASCVD Risk score (Arnett DK, et al., 2019) failed to calculate for the following reasons:   The 2019 ASCVD risk score is only valid for ages 27 to 26   The patient has a prior MI or stroke  diagnosis  ---------------------------------------------------------------------------------------------------   Medications: Outpatient Medications Prior to Visit  Medication Sig   Dapsone 7.5 % GEL Apply 1 application topically in the morning.   doxycycline (VIBRA-TABS) 100 MG tablet Take one tab po QD with food.   drospirenone-ethinyl estradiol (OCELLA) 3-0.03 MG tablet Take 1 tablet by mouth daily. CONTINUOUS DOSING   fexofenadine (ALLEGRA) 180 MG tablet Take 180 mg by mouth daily.   metFORMIN (GLUCOPHAGE-XR) 500 MG 24 hr tablet TAKE 2 TABLETS (1,000 MG TOTAL) BY MOUTH 2 (TWO) TIMES DAILY   metoprolol tartrate (LOPRESSOR) 25 MG tablet Take 1 tablet (25 mg total) by mouth 2 (two) times daily.   mupirocin ointment (BACTROBAN) 2 % Apply 1 application. topically daily. With dressing changes   spironolactone (ALDACTONE) 100 MG tablet Take 1 tablet (100 mg total) by mouth daily.   tretinoin (RETIN-A) 0.05 % cream Apply a pea sized amount to the entire face QHS.   No facility-administered medications prior to visit.    Review of Systems per HPI      Objective    BP 116/88 Comment: home reading  Pulse 90   Temp 98.2 F (36.8 C) (Oral)   Resp 16   Wt 234 lb 3.2 oz (106.2 kg)   BMI 37.80 kg/m  BP Readings from Last 3 Encounters:  12/31/21 116/88  11/21/21 128/90  10/22/21 119/89   Wt Readings from Last 3 Encounters:  12/31/21 234 lb 3.2  oz (106.2 kg)  11/21/21 235 lb (106.6 kg)  10/19/21 235 lb 6 oz (106.8 kg)      Physical Exam Vitals reviewed.  Constitutional:      General: She is not in acute distress.    Appearance: Normal appearance. She is well-developed. She is not diaphoretic.  HENT:     Head: Normocephalic and atraumatic.  Eyes:     General: No scleral icterus.    Conjunctiva/sclera: Conjunctivae normal.  Neck:     Thyroid: No thyromegaly.  Cardiovascular:     Rate and Rhythm: Normal rate and regular rhythm.     Pulses: Normal pulses.     Heart sounds:  Normal heart sounds. No murmur heard. Pulmonary:     Effort: Pulmonary effort is normal. No respiratory distress.     Breath sounds: Normal breath sounds. No wheezing, rhonchi or rales.  Musculoskeletal:     Cervical back: Neck supple.     Right lower leg: No edema.     Left lower leg: No edema.  Lymphadenopathy:     Cervical: No cervical adenopathy.  Skin:    General: Skin is warm and dry.  Neurological:     Mental Status: She is alert and oriented to person, place, and time. Mental status is at baseline.  Psychiatric:        Mood and Affect: Mood normal.        Behavior: Behavior normal.      No results found for any visits on 12/31/21.  Assessment & Plan     Problem List Items Addressed This Visit       Cardiovascular and Mediastinum   Essential hypertension - Primary    Well controlled on home readigns Continue lifestyle management - no meds Reviewed recent metabolic panel        Endocrine   PCOS (polycystic ovarian syndrome)    Discussed role of GLP1s/Mounjaro in PCOS associated with insulin resistance and weight management Discussed the pros and cons of management with these Discussed possible side effects Discussed that these are believed to likely be lifelong treatments She will also discuss with her endocrinologist and she will look into insurance coverage       Thyroid nodule greater than or equal to 1 cm in diameter incidentally noted on imaging study    F/b Endocrinology Has repeat Thyroid US scheduled in 8/23.         Other   Obesity    Discussed importance of healthy weight management Discussed diet and exercise  See discussion above regarding GLP-1's and Mounjaro        Return in about 8 months (around 09/02/2022) for CPE.      I, [name], MD, have reviewed all documentation for this visit. The documentation on 12/31/21 for the exam, diagnosis, procedures, and orders are all accurate and complete.   Pierina Schuknecht, Dionne Bucy, MD,  [facility]

## 2021-12-31 ENCOUNTER — Ambulatory Visit: Payer: BC Managed Care – PPO | Admitting: Family Medicine

## 2021-12-31 ENCOUNTER — Encounter: Payer: Self-pay | Admitting: Family Medicine

## 2021-12-31 VITALS — BP 116/88 | HR 90 | Temp 98.2°F | Resp 16 | Wt 234.2 lb

## 2021-12-31 DIAGNOSIS — E041 Nontoxic single thyroid nodule: Secondary | ICD-10-CM

## 2021-12-31 DIAGNOSIS — I10 Essential (primary) hypertension: Secondary | ICD-10-CM

## 2021-12-31 DIAGNOSIS — E282 Polycystic ovarian syndrome: Secondary | ICD-10-CM

## 2021-12-31 DIAGNOSIS — Z6837 Body mass index (BMI) 37.0-37.9, adult: Secondary | ICD-10-CM

## 2021-12-31 NOTE — Assessment & Plan Note (Signed)
Discussed role of GLP1s/Mounjaro in PCOS associated with insulin resistance and weight management Discussed the pros and cons of management with these Discussed possible side effects Discussed that these are believed to likely be lifelong treatments She will also discuss with her endocrinologist and she will look into insurance coverage

## 2021-12-31 NOTE — Assessment & Plan Note (Signed)
F/b Endocrinology Has repeat Thyroid US scheduled in 8/23.

## 2021-12-31 NOTE — Assessment & Plan Note (Addendum)
Discussed importance of healthy weight management Discussed diet and exercise  See discussion above regarding GLP-1's and Mounjaro

## 2021-12-31 NOTE — Assessment & Plan Note (Signed)
Well controlled on home readigns Continue lifestyle management - no meds Reviewed recent metabolic panel

## 2022-02-08 ENCOUNTER — Ambulatory Visit: Payer: BC Managed Care – PPO | Admitting: Family Medicine

## 2022-03-07 LAB — BASIC METABOLIC PANEL
BUN: 12 (ref 4–21)
CO2: 30 — AB (ref 13–22)
Chloride: 104 (ref 99–108)
Creatinine: 0.7 (ref 0.5–1.1)
Glucose: 94
Potassium: 4 mEq/L (ref 3.5–5.1)
Sodium: 138 (ref 137–147)

## 2022-03-07 LAB — COMPREHENSIVE METABOLIC PANEL
Calcium: 9.2 (ref 8.7–10.7)
eGFR: 93

## 2022-03-07 LAB — HEMOGLOBIN A1C: Hemoglobin A1C: 5.6

## 2022-03-07 LAB — TSH: TSH: 1.55 (ref 0.41–5.90)

## 2022-07-18 ENCOUNTER — Other Ambulatory Visit: Payer: Self-pay | Admitting: Internal Medicine

## 2022-09-04 ENCOUNTER — Ambulatory Visit: Payer: BC Managed Care – PPO | Admitting: Dermatology

## 2022-09-04 DIAGNOSIS — Z1283 Encounter for screening for malignant neoplasm of skin: Secondary | ICD-10-CM

## 2022-09-04 DIAGNOSIS — Z79899 Other long term (current) drug therapy: Secondary | ICD-10-CM | POA: Diagnosis not present

## 2022-09-04 DIAGNOSIS — L700 Acne vulgaris: Secondary | ICD-10-CM

## 2022-09-04 DIAGNOSIS — Z86018 Personal history of other benign neoplasm: Secondary | ICD-10-CM

## 2022-09-04 DIAGNOSIS — E282 Polycystic ovarian syndrome: Secondary | ICD-10-CM

## 2022-09-04 DIAGNOSIS — D225 Melanocytic nevi of trunk: Secondary | ICD-10-CM | POA: Diagnosis not present

## 2022-09-04 DIAGNOSIS — D485 Neoplasm of uncertain behavior of skin: Secondary | ICD-10-CM

## 2022-09-04 DIAGNOSIS — L578 Other skin changes due to chronic exposure to nonionizing radiation: Secondary | ICD-10-CM

## 2022-09-04 DIAGNOSIS — L821 Other seborrheic keratosis: Secondary | ICD-10-CM

## 2022-09-04 DIAGNOSIS — L814 Other melanin hyperpigmentation: Secondary | ICD-10-CM

## 2022-09-04 DIAGNOSIS — D229 Melanocytic nevi, unspecified: Secondary | ICD-10-CM

## 2022-09-04 MED ORDER — SPIRONOLACTONE 100 MG PO TABS: 100.0000 mg | ORAL_TABLET | Freq: Every day | ORAL | 6 refills | Status: DC

## 2022-09-04 MED ORDER — TRETINOIN 0.05 % EX CREA: TOPICAL_CREAM | CUTANEOUS | 6 refills | Status: DC

## 2022-09-04 MED ORDER — DAPSONE 7.5 % EX GEL: 1.0000 "application " | Freq: Every morning | CUTANEOUS | 6 refills | Status: DC

## 2022-09-04 MED ORDER — DOXYCYCLINE HYCLATE 100 MG PO TABS: ORAL_TABLET | ORAL | 6 refills | Status: DC

## 2022-09-04 NOTE — Patient Instructions (Signed)
Wound Care Instructions  Cleanse wound gently with soap and water once a day then pat dry with clean gauze. Apply a thin coat of Petrolatum (petroleum jelly, "Vaseline") over the wound (unless you have an allergy to this). We recommend that you use a new, sterile tube of Vaseline. Do not pick or remove scabs. Do not remove the yellow or white "healing tissue" from the base of the wound.  Cover the wound with fresh, clean, nonstick gauze and secure with paper tape. You may use Band-Aids in place of gauze and tape if the wound is small enough, but would recommend trimming much of the tape off as there is often too much. Sometimes Band-Aids can irritate the skin.  You should call the office for your biopsy report after 1 week if you have not already been contacted.  If you experience any problems, such as abnormal amounts of bleeding, swelling, significant bruising, significant pain, or evidence of infection, please call the office immediately.  FOR ADULT SURGERY PATIENTS: If you need something for pain relief you may take 1 extra strength Tylenol (acetaminophen) AND 2 Ibuprofen (200mg each) together every 4 hours as needed for pain. (do not take these if you are allergic to them or if you have a reason you should not take them.) Typically, you may only need pain medication for 1 to 3 days.     Due to recent changes in healthcare laws, you may see results of your pathology and/or laboratory studies on MyChart before the doctors have had a chance to review them. We understand that in some cases there may be results that are confusing or concerning to you. Please understand that not all results are received at the same time and often the doctors may need to interpret multiple results in order to provide you with the best plan of care or course of treatment. Therefore, we ask that you please give us 2 business days to thoroughly review all your results before contacting the office for clarification. Should  we see a critical lab result, you will be contacted sooner.   If You Need Anything After Your Visit  If you have any questions or concerns for your doctor, please call our main line at [phone] and press option 4 to reach your doctor's medical assistant. If no one answers, please leave a voicemail as directed and we will return your call as soon as possible. Messages left after 4 pm will be answered the following business day.   You may also send us a message via MyChart. We typically respond to MyChart messages within 1-2 business days.  For prescription refills, please ask your pharmacy to contact our office. Our fax number is [phone].  If you have an urgent issue when the clinic is closed that cannot wait until the next business day, you can page your doctor at the number below.    Please note that while we do our best to be available for urgent issues outside of office hours, we are not available 24/7.   If you have an urgent issue and are unable to reach us, you may choose to seek medical care at your doctor's office, retail clinic, urgent care center, or emergency room.  If you have a medical emergency, please immediately call 911 or go to the emergency department.  Pager Numbers  - Dr. Kowalski: [phone]  - Dr. Moye: [phone]  - Dr. Stewart: [phone]  In the event of inclement weather, please call our main line at   [phone] for an update on the status of any delays or closures.  Dermatology Medication Tips: Please keep the boxes that topical medications come in in order to help keep track of the instructions about where and how to use these. Pharmacies typically print the medication instructions only on the boxes and not directly on the medication tubes.   If your medication is too expensive, please contact our office at [phone] option 4 or send us a message through MyChart.   We are unable to tell what your co-pay for medications will be in  advance as this is different depending on your insurance coverage. However, we may be able to find a substitute medication at lower cost or fill out paperwork to get insurance to cover a needed medication.   If a prior authorization is required to get your medication covered by your insurance company, please allow us 1-2 business days to complete this process.  Drug prices often vary depending on where the prescription is filled and some pharmacies may offer cheaper prices.  The website www.goodrx.com contains coupons for medications through different pharmacies. The prices here do not account for what the cost may be with help from insurance (it may be cheaper with your insurance), but the website can give you the price if you did not use any insurance.  - You can print the associated coupon and take it with your prescription to the pharmacy.  - You may also stop by our office during regular business hours and pick up a GoodRx coupon card.  - If you need your prescription sent electronically to a different pharmacy, notify our office through [facility] MyChart or by phone at [phone] option 4.     Si Usted Necesita Algo Despus de Su Visita  Tambin puede enviarnos un mensaje a travs de MyChart. Por lo general respondemos a los mensajes de MyChart en el transcurso de 1 a 2 das hbiles.  Para renovar recetas, por favor pida a su farmacia que se ponga en contacto con nuestra oficina. Nuestro nmero de fax es el [phone].  Si tiene un asunto urgente cuando la clnica est cerrada y que no puede esperar hasta el siguiente da hbil, puede llamar/localizar a su doctor(a) al nmero que aparece a continuacin.   Por favor, tenga en cuenta que aunque hacemos todo lo posible para estar disponibles para asuntos urgentes fuera del horario de oficina, no estamos disponibles las 24 horas del da, los 7 das de la semana.   Si tiene un problema urgente y no puede comunicarse con nosotros, puede  optar por buscar atencin mdica  en el consultorio de su doctor(a), en una clnica privada, en un centro de atencin urgente o en una sala de emergencias.  Si tiene una emergencia mdica, por favor llame inmediatamente al 911 o vaya a la sala de emergencias.  Nmeros de bper  - Dr. Kowalski: [phone]  - Dra. Moye: [phone]  - Dra. Stewart: [phone]  En caso de inclemencias del tiempo, por favor llame a nuestra lnea principal al [phone] para una actualizacin sobre el estado de cualquier retraso o cierre.  Consejos para la medicacin en dermatologa: Por favor, guarde las cajas en las que vienen los medicamentos de uso tpico para ayudarle a seguir las instrucciones sobre dnde y cmo usarlos. Las farmacias generalmente imprimen las instrucciones del medicamento slo en las cajas y no directamente en los tubos del medicamento.   Si su medicamento es muy caro, por favor, pngase en contacto con   nuestra oficina llamando al [phone] y presione la opcin 4 o envenos un mensaje a travs de MyChart.   No podemos decirle cul ser su copago por los medicamentos por adelantado ya que esto es diferente dependiendo de la cobertura de su seguro. Sin embargo, es posible que podamos encontrar un medicamento sustituto a menor costo o llenar un formulario para que el seguro cubra el medicamento que se considera necesario.   Si se requiere una autorizacin previa para que su compaa de seguros cubra su medicamento, por favor permtanos de 1 a 2 das hbiles para completar este proceso.  Los precios de los medicamentos varan con frecuencia dependiendo del lugar de dnde se surte la receta y alguna farmacias pueden ofrecer precios ms baratos.  El sitio web www.goodrx.com tiene cupones para medicamentos de diferentes farmacias. Los precios aqu no tienen en cuenta lo que podra costar con la ayuda del seguro (puede ser ms barato con su seguro), pero el sitio web puede darle el  precio si no utiliz ningn seguro.  - Puede imprimir el cupn correspondiente y llevarlo con su receta a la farmacia.  - Tambin puede pasar por nuestra oficina durante el horario de atencin regular y recoger una tarjeta de cupones de GoodRx.  - Si necesita que su receta se enve electrnicamente a una farmacia diferente, informe a nuestra oficina a travs de MyChart de [place] o por telfono llamando al [phone] y presione la opcin 4.  

## 2022-09-04 NOTE — Progress Notes (Unsigned)
Follow-Up Visit   Subjective  Elizabeth Hurst is a 42 y.o. female who presents for the following: Annual Exam (History of dysplastic nevi - The patient presents for Total-Body Skin Exam (TBSE) for skin cancer screening and mole check.  The patient has spots, moles and lesions to be evaluated, some may be new or changing and the patient has concerns that these could be cancer./).  The following portions of the chart were reviewed this encounter and updated as appropriate:   Tobacco  Allergies  Meds  Problems  Med Hx  Surg Hx  Fam Hx     Review of Systems:  No other skin or systemic complaints except as noted in HPI or Assessment and Plan.  Objective  Well appearing patient in no apparent distress; mood and affect are within normal limits.  A full examination was performed including scalp, head, eyes, ears, nose, lips, neck, chest, axillae, abdomen, back, buttocks, bilateral upper extremities, bilateral lower extremities, hands, feet, fingers, toes, fingernails, and toenails. All findings within normal limits unless otherwise noted below.  Head - Anterior (Face) Clear today  Left UQA 0.5 cm irregular brown macule      Assessment & Plan   History of Dysplastic Nevi - No evidence of recurrence today - Recommend regular full body skin exams - Recommend daily broad spectrum sunscreen SPF 30+ to sun-exposed areas, reapply every 2 hours as needed.  - Call if any new or changing lesions are noted between office visits  Lentigines - Scattered tan macules - Due to sun exposure - Benign-appearing, observe - Recommend daily broad spectrum sunscreen SPF 30+ to sun-exposed areas, reapply every 2 hours as needed. - Call for any changes  Seborrheic Keratoses - Stuck-on, waxy, tan-brown papules and/or plaques  - Benign-appearing - Discussed benign etiology and prognosis. - Observe - Call for any changes  Melanocytic Nevi - Tan-brown and/or pink-flesh-colored symmetric  macules and papules - Benign appearing on exam today - Observation - Call clinic for new or changing moles - Recommend daily use of broad spectrum spf 30+ sunscreen to sun-exposed areas.   Hemangiomas - Red papules - Discussed benign nature - Observe - Call for any changes  Actinic Damage - Chronic condition, secondary to cumulative UV/sun exposure - diffuse scaly erythematous macules with underlying dyspigmentation - Recommend daily broad spectrum sunscreen SPF 30+ to sun-exposed areas, reapply every 2 hours as needed.  - Staying in the shade or wearing long sleeves, sun glasses (UVA+UVB protection) and wide brim hats (4-inch brim around the entire circumference of the hat) are also recommended for sun protection.  - Call for new or changing lesions.  Skin cancer screening performed today.  Acne vulgaris Head - Anterior (Face)  Discussed Isotretinoin - patient declines today (again)  Continue Doxycycline 100 mg 1 po qd Spironolactone 100 mg 1 po qd Aczone 7.5% qam Tretinoin 0.05% cream qhs  doxycycline (VIBRA-TABS) 100 MG tablet - Head - Anterior (Face) Take one tab po QD with food.  Dapsone 7.5 % GEL - Head - Anterior (Face) Apply 1 application  topically in the morning.  tretinoin (RETIN-A) 0.05 % cream - Head - Anterior (Face) Apply a pea sized amount to the entire face QHS.  PCOS (polycystic ovarian syndrome)  Related Medications drospirenone-ethinyl estradiol (OCELLA) 3-0.03 MG tablet Take 1 tablet by mouth daily. CONTINUOUS DOSING  spironolactone (ALDACTONE) 100 MG tablet Take 1 tablet (100 mg total) by mouth daily.  Neoplasm of uncertain behavior of skin Left UQA  Epidermal /  dermal shaving  Lesion diameter (cm):  0.5 Informed consent: discussed and consent obtained   Timeout: patient name, date of birth, surgical site, and procedure verified   Procedure prep:  Patient was prepped and draped in usual sterile fashion Prep type:  Isopropyl  alcohol Anesthesia: the lesion was anesthetized in a standard fashion   Anesthetic:  1% lidocaine w/ epinephrine 1-100,000 buffered w/ 8.4% NaHCO3 Instrument used: flexible razor blade   Hemostasis achieved with: pressure, aluminum chloride and electrodesiccation   Outcome: patient tolerated procedure well   Post-procedure details: sterile dressing applied and wound care instructions given   Dressing type: bandage and petrolatum    Specimen 1 - Surgical pathology Differential Diagnosis: Nevus vs dysplastic nevus Check Margins: No   Return in about 1 year (around 09/05/2023) for TBSE.  I, Ashok Cordia, CMA, am acting as scribe for Sarina Ser, MD . Documentation: I have reviewed the above documentation for accuracy and completeness, and I agree with the above.  Sarina Ser, MD

## 2022-09-05 ENCOUNTER — Encounter: Payer: Self-pay | Admitting: Dermatology

## 2022-09-10 ENCOUNTER — Telehealth: Payer: Self-pay

## 2022-09-10 NOTE — Telephone Encounter (Signed)
-----   Message from Brendolyn Patty, MD sent at 09/10/2022 12:01 PM EST ----- Skin , left UQA DYSPLASTIC COMPOUND NEVUS WITH MILD ATYPIA, PERIPHERAL MARGIN INVOLVED  Mildly atypical mole, observation - please call patient

## 2022-09-10 NOTE — Telephone Encounter (Signed)
Left message on voicemail to return my call.  

## 2022-09-10 NOTE — Telephone Encounter (Signed)
Patient notified of bx results  

## 2022-09-19 ENCOUNTER — Encounter: Payer: Self-pay | Admitting: Family Medicine

## 2022-09-19 ENCOUNTER — Ambulatory Visit (INDEPENDENT_AMBULATORY_CARE_PROVIDER_SITE_OTHER): Payer: BC Managed Care – PPO | Admitting: Family Medicine

## 2022-09-19 VITALS — BP 130/76 | HR 93 | Temp 98.2°F | Resp 14 | Ht 66.0 in | Wt 228.0 lb

## 2022-09-19 DIAGNOSIS — E041 Nontoxic single thyroid nodule: Secondary | ICD-10-CM

## 2022-09-19 DIAGNOSIS — I10 Essential (primary) hypertension: Secondary | ICD-10-CM | POA: Diagnosis not present

## 2022-09-19 DIAGNOSIS — K7581 Nonalcoholic steatohepatitis (NASH): Secondary | ICD-10-CM

## 2022-09-19 DIAGNOSIS — Z1231 Encounter for screening mammogram for malignant neoplasm of breast: Secondary | ICD-10-CM

## 2022-09-19 DIAGNOSIS — R739 Hyperglycemia, unspecified: Secondary | ICD-10-CM

## 2022-09-19 DIAGNOSIS — Z6836 Body mass index (BMI) 36.0-36.9, adult: Secondary | ICD-10-CM

## 2022-09-19 DIAGNOSIS — Z0000 Encounter for general adult medical examination without abnormal findings: Secondary | ICD-10-CM

## 2022-09-19 MED ORDER — METOPROLOL TARTRATE 25 MG PO TABS: 25.0000 mg | ORAL_TABLET | Freq: Two times a day (BID) | ORAL | 3 refills | Status: DC

## 2022-09-19 NOTE — Assessment & Plan Note (Signed)
Well controlled Continue current medications Recheck metabolic panel F/u in 6 months  

## 2022-09-19 NOTE — Assessment & Plan Note (Signed)
Followed by endocrinology Recheck TSH

## 2022-09-19 NOTE — Assessment & Plan Note (Signed)
Discussed importance of healthy weight management Discussed diet and exercise Discussed GLP-1's for weight loss and that her insurance does not cover them and they are expensive without insurance coverage She will hold off at this time

## 2022-09-19 NOTE — Progress Notes (Signed)
I,Sulibeya S Dimas,acting as a Education administrator for [name], MD.,have documented all relevant documentation on the behalf of [name], MD,as directed by  [name], MD while in the presence of [name], MD.    Complete physical exam   Patient: Elizabeth Hurst   DOB: 08-01-1980   42 y.o. Female  MRN: [id number] Visit Date: 09/19/2022  Today's healthcare provider: [name], MD   Chief Complaint  Patient presents with   Annual Exam   Subjective    Elizabeth Hurst is a 42 y.o. female who presents today for a complete physical exam.  She reports consuming a general diet. The patient does not participate in regular exercise at present. She generally feels well. She reports sleeping well. She does have additional problems to discuss today.  HPI  Tdap vaccine-patient declined due to local reaction with last tetnus shot.    Past Medical History:  Diagnosis Date   Dysplastic nevus 08/29/2021   left ear sup helix, mod   Dysplastic nevus 08/29/2021   mid back spinal-mod to severe, Excised 10/16/21   Dysplastic nevus 09/04/2022   LUQA, mild   Hypertension    Hypertensive urgency 06/11/2021   Polycystic ovaries    Past Surgical History:  Procedure Laterality Date   SKIN CANCER EXCISION     Non Cancerous   Social History   Socioeconomic History   Marital status: Single    Spouse name: Not on file   Number of children: Not on file   Years of education: Not on file   Highest education level: Not on file  Occupational History   Not on file  Tobacco Use   Smoking status: Never   Smokeless tobacco: Never  Vaping Use   Vaping Use: Former  Substance and Sexual Activity   Alcohol use: Yes    Comment: 1-2 glasses of wine per week   Drug use: No   Sexual activity: Never    Birth control/protection: Pill  Other Topics Concern   Not on file  Social History Narrative   Not on file   Social Determinants of Health   Financial Resource  Strain: Not on file  Food Insecurity: Not on file  Transportation Needs: Not on file  Physical Activity: Not on file  Stress: Not on file  Social Connections: Not on file  Intimate Partner Violence: Not on file   Family Status  Relation Name Status   Mother  Alive   Father  Alive   MGM  Alive   MGF  Deceased   PGM  Deceased   PGF  Deceased   Cousin Maternal Deceased   Family History  Problem Relation Age of Onset   Hyperlipidemia Mother    Hyperlipidemia Father    Hyperlipidemia Maternal Grandmother    Hyperlipidemia Maternal Grandfather    Hyperlipidemia Paternal Grandmother    Lymphoma Paternal Grandmother    Hyperlipidemia Paternal Grandfather    Lung cancer Paternal Grandfather    Ovarian cancer Cousin        3rd cousin   Allergies  Allergen Reactions   Tetanus Toxoids Rash    patient states she has a large local swelling and rash reaction to the tetnaus shot and that she was told before to take benadryl prior to recieving vaccine    Patient Care Team: Brita Romp, Dionne Bucy, MD as PCP - General (Family Medicine) End, Harrell Gave, MD as PCP - Cardiology (Cardiology)   Medications: Outpatient Medications Prior to Visit  Medication Sig  Dapsone 7.5 % GEL Apply 1 application  topically in the morning.   doxycycline (VIBRA-TABS) 100 MG tablet Take one tab po QD with food.   drospirenone-ethinyl estradiol (OCELLA) 3-0.03 MG tablet Take 1 tablet by mouth daily. CONTINUOUS DOSING   fexofenadine (ALLEGRA) 180 MG tablet Take 180 mg by mouth daily.   metFORMIN (GLUCOPHAGE-XR) 500 MG 24 hr tablet TAKE 2 TABLETS (1,000 MG TOTAL) BY MOUTH 2 (TWO) TIMES DAILY   mupirocin ointment (BACTROBAN) 2 % Apply 1 application. topically daily. With dressing changes   spironolactone (ALDACTONE) 100 MG tablet Take 1 tablet (100 mg total) by mouth daily.   tretinoin (RETIN-A) 0.05 % cream Apply a pea sized amount to the entire face QHS.   [DISCONTINUED] metoprolol tartrate (LOPRESSOR) 25 MG  tablet TAKE 1 TABLET BY MOUTH TWICE A DAY   No facility-administered medications prior to visit.    Review of Systems  All other systems reviewed and are negative.  Last CBC Lab Results  Component Value Date   WBC 9.0 06/12/2021   HGB 14.6 06/12/2021   HCT 44.7 06/12/2021   MCV 92.2 06/12/2021   MCH 30.1 06/12/2021   RDW 12.4 06/12/2021   PLT 335 [id number]   Last metabolic panel Lab Results  Component Value Date   GLUCOSE 87 08/20/2021   NA 138 03/07/2022   K 4.0 03/07/2022   CL 104 03/07/2022   CO2 30 (A) 03/07/2022   BUN 12 03/07/2022   CREATININE 0.7 03/07/2022   EGFR 93 03/07/2022   CALCIUM 9.2 03/07/2022   PROT 7.0 03/09/2021   ALBUMIN 4.3 03/09/2021   LABGLOB 2.7 03/09/2021   AGRATIO 1.6 03/09/2021   BILITOT 0.2 03/09/2021   ALKPHOS 49 03/09/2021   AST 28 03/09/2021   ALT 36 (H) 03/09/2021   ANIONGAP 6 06/12/2021   Last lipids Lab Results  Component Value Date   CHOL 187 06/12/2021   HDL 60 06/12/2021   LDLCALC 98 06/12/2021   TRIG 147 06/12/2021   CHOLHDL 3.1 06/12/2021   Last hemoglobin A1c Lab Results  Component Value Date   HGBA1C 5.6 03/07/2022   Last thyroid functions Lab Results  Component Value Date   TSH 1.55 03/07/2022   T4TOTAL 10.7 06/12/2021      Objective    BP 130/76 Comment: home readings  Pulse 93   Temp 98.2 F (36.8 C) (Temporal)   Resp 14   Ht 5' 6"$  (1.676 m)   Wt 228 lb (103.4 kg)   SpO2 98%   BMI 36.80 kg/m  BP Readings from Last 3 Encounters:  09/19/22 130/76  12/31/21 116/88  11/21/21 128/90   Wt Readings from Last 3 Encounters:  09/19/22 228 lb (103.4 kg)  12/31/21 234 lb 3.2 oz (106.2 kg)  11/21/21 235 lb (106.6 kg)       Physical Exam Vitals reviewed.  Constitutional:      General: She is not in acute distress.    Appearance: Normal appearance. She is well-developed. She is not diaphoretic.  HENT:     Head: Normocephalic and atraumatic.     Right Ear: Tympanic membrane, ear canal and  external ear normal.     Left Ear: Tympanic membrane, ear canal and external ear normal.     Nose: Nose normal.     Mouth/Throat:     Mouth: Mucous membranes are moist.     Pharynx: Oropharynx is clear. No oropharyngeal exudate.  Eyes:     General: No scleral icterus.  Conjunctiva/sclera: Conjunctivae normal.     Pupils: Pupils are equal, round, and reactive to light.  Neck:     Thyroid: No thyromegaly.  Cardiovascular:     Rate and Rhythm: Normal rate and regular rhythm.     Pulses: Normal pulses.     Heart sounds: Normal heart sounds. No murmur heard. Pulmonary:     Effort: Pulmonary effort is normal. No respiratory distress.     Breath sounds: Normal breath sounds. No wheezing or rales.  Abdominal:     General: There is no distension.     Palpations: Abdomen is soft.     Tenderness: There is no abdominal tenderness.  Musculoskeletal:        General: No deformity.     Cervical back: Neck supple.     Right lower leg: No edema.     Left lower leg: No edema.  Lymphadenopathy:     Cervical: No cervical adenopathy.  Skin:    General: Skin is warm and dry.     Findings: No rash.  Neurological:     Mental Status: She is alert and oriented to person, place, and time. Mental status is at baseline.     Gait: Gait normal.  Psychiatric:        Mood and Affect: Mood normal.        Behavior: Behavior normal.        Thought Content: Thought content normal.       Last depression screening scores    09/19/2022    3:59 PM 12/31/2021    3:53 PM 09/13/2021    3:40 PM  PHQ 2/9 Scores  PHQ - 2 Score 0 0 0  PHQ- 9 Score 0  0   Last fall risk screening    09/19/2022    3:59 PM  [place] in the past year? 0  Number falls in past yr: 0  Injury with Fall? 0  Risk for fall due to : No Fall Risks  Follow up Falls evaluation completed   Last Audit-C alcohol use screening    09/19/2022    3:59 PM  Alcohol Use Disorder Test (AUDIT)  1. How often do you have a drink  containing alcohol? 2  2. How many drinks containing alcohol do you have on a typical day when you are drinking? 0  3. How often do you have six or more drinks on one occasion? 0  AUDIT-C Score 2   A score of 3 or more in women, and 4 or more in men indicates increased risk for alcohol abuse, EXCEPT if all of the points are from question 1   Results for orders placed or performed in visit on [id number]  Basic metabolic panel  Result Value Ref Range   Glucose 94    BUN 12 4 - 21   CO2 30 (A) 13 - 22   Creatinine 0.7 0.5 - 1.1   Potassium 4.0 3.5 - 5.1 mEq/L   Sodium 138 137 - 147   Chloride 104 99 - 108  Comprehensive metabolic panel  Result Value Ref Range   eGFR 93    Calcium 9.2 8.7 - 10.7  Hemoglobin A1c  Result Value Ref Range   Hemoglobin A1C 5.6   TSH  Result Value Ref Range   TSH 1.55 0.41 - 5.90    Assessment & Plan    Routine Health Maintenance and Physical Exam  Exercise Activities and Dietary recommendations  Goals   None  Immunization History  Administered Date(s) Administered   COVID-19, mRNA, vaccine(Comirnaty)12 years and older 05/24/2022   Hepatitis A, Adult 03/10/2013, 09/14/2013   Influenza Inj Mdck Quad Pf 06/12/2017, 05/20/2018   Influenza,inj,Quad PF,6+ Mos 06/15/2016, 04/23/2019, 04/27/2020   Influenza-Unspecified 04/06/2021, 05/24/2022   Moderna Covid Bivalent Peds Booster(81moThru 576yr 04/06/2021   Moderna Sars-Covid-2 Vaccination 09/23/2019, 10/21/2019, 05/28/2020   Tdap 12/02/2005    Health Maintenance  Topic Date Due   DTaP/Tdap/Td (2 - Td or Tdap) 12/03/2015   COVID-19 Vaccine (6 - 2023-24 season) 07/19/2022   PAP SMEAR-Modifier  09/16/2023   INFLUENZA VACCINE  Completed   Hepatitis C Screening  Completed   HIV Screening  Completed   HPV VACCINES  Aged Out    Discussed health benefits of physical activity, and encouraged her to engage in regular exercise appropriate for her age and condition.  Problem List Items Addressed  This Visit       Cardiovascular and Mediastinum   Essential hypertension    Well controlled Continue current medications Recheck metabolic panel F/u in 6 months       Relevant Medications   metoprolol tartrate (LOPRESSOR) 25 MG tablet   Other Relevant Orders   Comprehensive metabolic panel     Digestive   NASH (nonalcoholic steatohepatitis)    Recheck LFTs      Relevant Orders   Comprehensive metabolic panel     Endocrine   Thyroid nodule greater than or equal to 1 cm in diameter incidentally noted on imaging study    Followed by endocrinology Recheck TSH      Relevant Medications   metoprolol tartrate (LOPRESSOR) 25 MG tablet   Other Relevant Orders   TSH     Other   Obesity    Discussed importance of healthy weight management Discussed diet and exercise Discussed GLP-1's for weight loss and that her insurance does not cover them and they are expensive without insurance coverage She will hold off at this time      Relevant Orders   Comprehensive metabolic panel   Lipid panel   Other Visit Diagnoses     Encounter for annual physical exam    -  Primary   Relevant Orders   TSH   Hemoglobin A1c   Comprehensive metabolic panel   Lipid panel   Primary hypertension       Relevant Medications   metoprolol tartrate (LOPRESSOR) 25 MG tablet   Other Relevant Orders   Comprehensive metabolic panel   Breast cancer screening by mammogram       Relevant Orders   MM 3D SCREEN BREAST BILATERAL   Hyperglycemia       Relevant Orders   Hemoglobin A1c        Return in about 6 months (around 03/20/2023) for chronic disease f/u.     I, AnLavon PaganiniMD, have reviewed all documentation for this visit. The documentation on 09/19/22 for the exam, diagnosis, procedures, and orders are all accurate and complete.   Linsi Humann, AnDionne BucyMD, MPH [facility] [place]

## 2022-09-19 NOTE — Assessment & Plan Note (Signed)
Recheck LFTs 

## 2022-09-20 LAB — COMPREHENSIVE METABOLIC PANEL
ALT: 15 IU/L (ref 0–32)
AST: 18 IU/L (ref 0–40)
Albumin/Globulin Ratio: 1.5 (ref 1.2–2.2)
Albumin: 4.1 g/dL (ref 3.9–4.9)
Alkaline Phosphatase: 41 IU/L — ABNORMAL LOW (ref 44–121)
BUN/Creatinine Ratio: 16 (ref 9–23)
BUN: 13 mg/dL (ref 6–24)
Bilirubin Total: <0.2 mg/dL (ref 0.0–1.2)
CO2: 23 mmol/L (ref 20–29)
Calcium: 9.8 mg/dL (ref 8.7–10.2)
Chloride: 97 mmol/L (ref 96–106)
Creatinine, Ser: 0.83 mg/dL (ref 0.57–1.00)
Globulin, Total: 2.7 g/dL (ref 1.5–4.5)
Glucose: 91 mg/dL (ref 70–99)
Potassium: 4.5 mmol/L (ref 3.5–5.2)
Sodium: 137 mmol/L (ref 134–144)
Total Protein: 6.8 g/dL (ref 6.0–8.5)
eGFR: 91 mL/min/{1.73_m2} (ref >59)

## 2022-09-20 LAB — LIPID PANEL
Chol/HDL Ratio: 2.8 ratio (ref 0.0–4.4)
Cholesterol, Total: 192 mg/dL (ref 100–199)
HDL: 68 mg/dL (ref >39)
LDL Chol Calc (NIH): 105 mg/dL — ABNORMAL HIGH (ref 0–99)
Triglycerides: 110 mg/dL (ref 0–149)
VLDL Cholesterol Cal: 19 mg/dL (ref 5–40)

## 2022-09-20 LAB — TSH: TSH: 2.07 u[IU]/mL (ref 0.450–4.500)

## 2022-09-20 LAB — HEMOGLOBIN A1C
Est. average glucose Bld gHb Est-mCnc: 117 mg/dL
Hgb A1c MFr Bld: 5.7 % — ABNORMAL HIGH (ref 4.8–5.6)

## 2023-02-05 IMAGING — US US THYROID
1 series · 13 of 25 positions shown · non-contrast
Comparison: Chest CT 06/11/2021

CLINICAL DATA: Thyroid nodule seen on chest CT.

EXAM:
THYROID ULTRASOUND
TECHNIQUE: Ultrasound examination of the thyroid gland and adjacent soft
tissues was performed.

[Series 1: us thyroid · 13 of 36 slices shown]
[im 1/36]
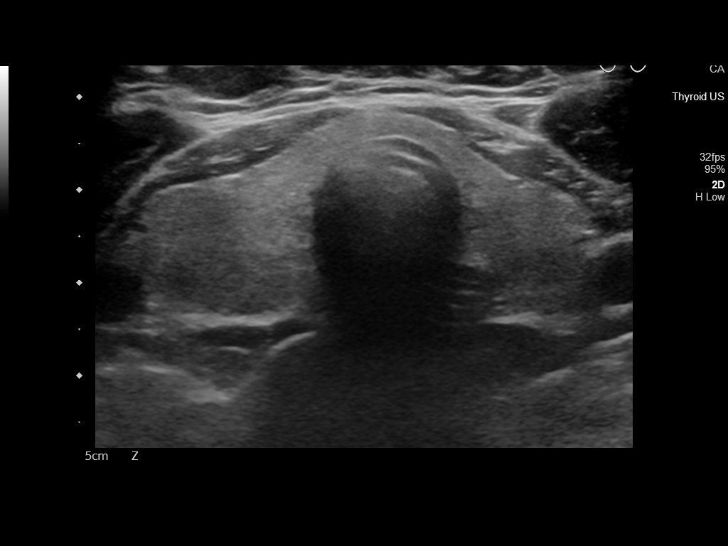
[im 3/36]
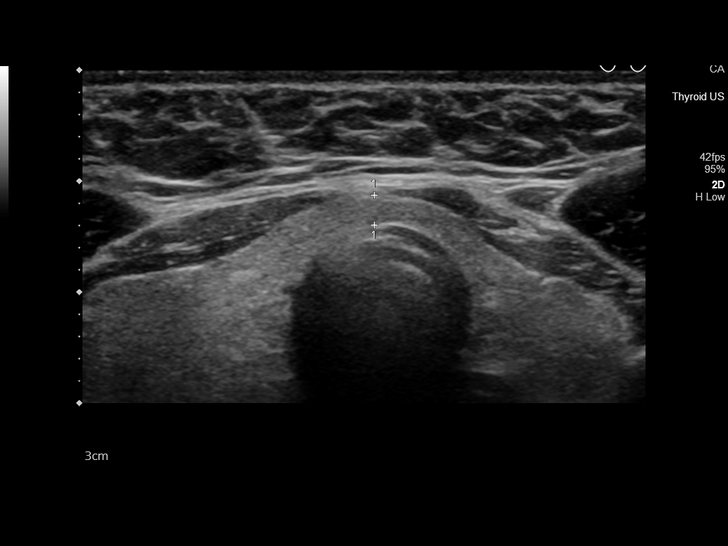
[im 6/36]
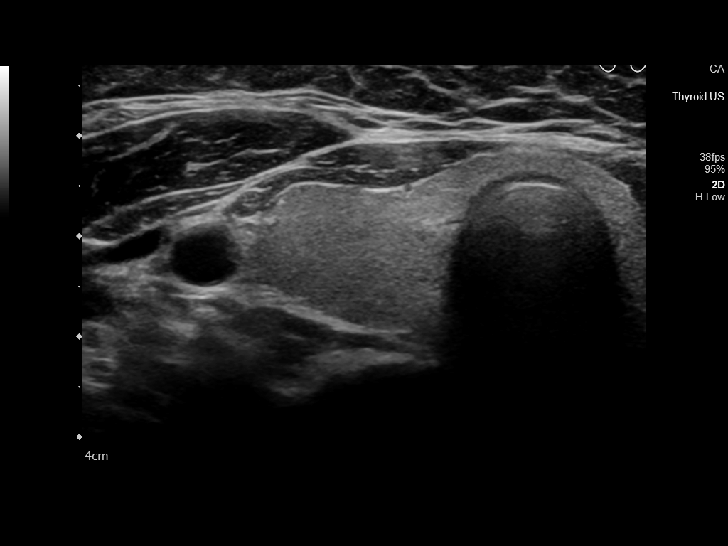
[im 9/36]
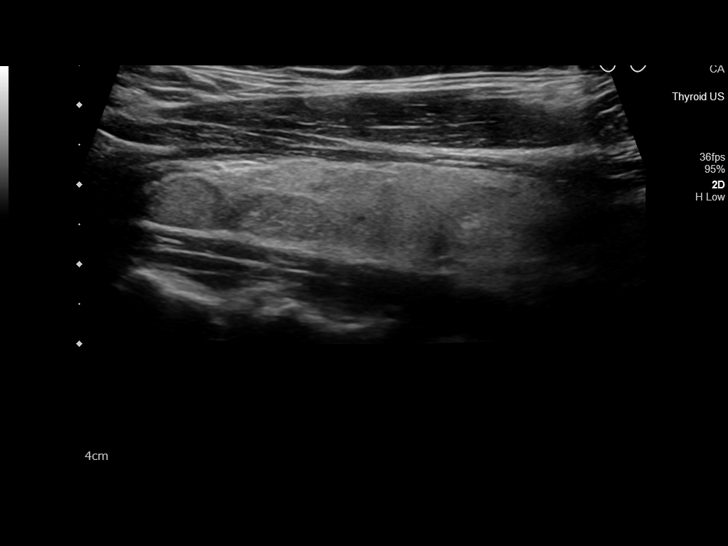
[im 12/36]
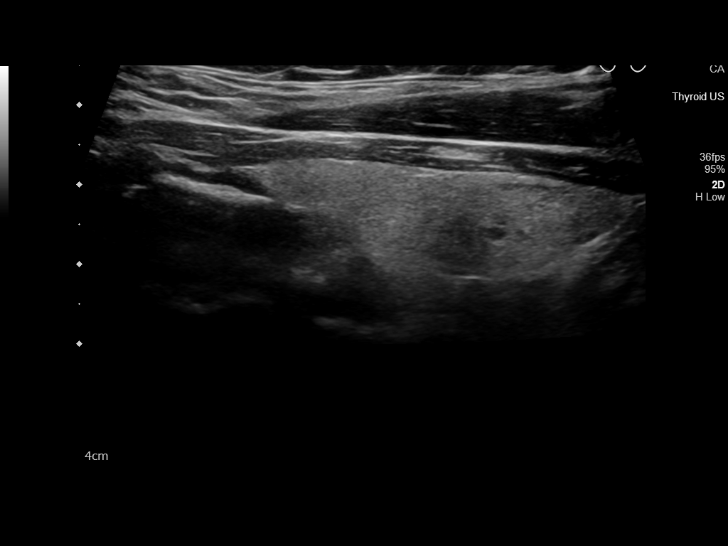
[im 15/36]
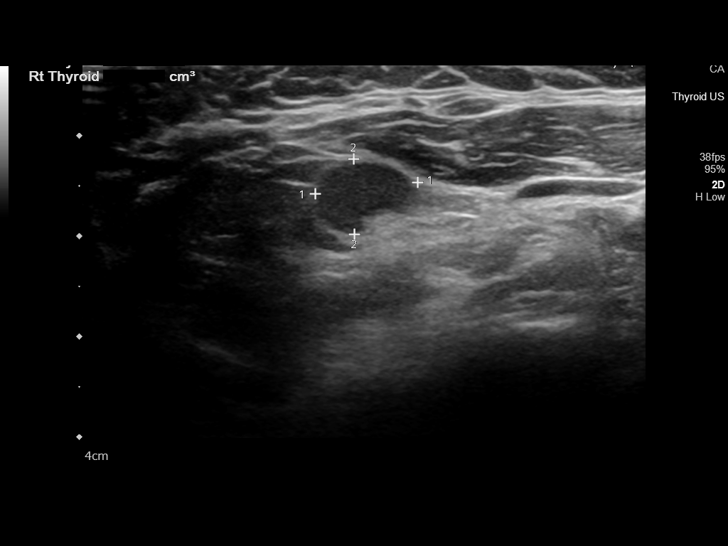
[im 18/36]
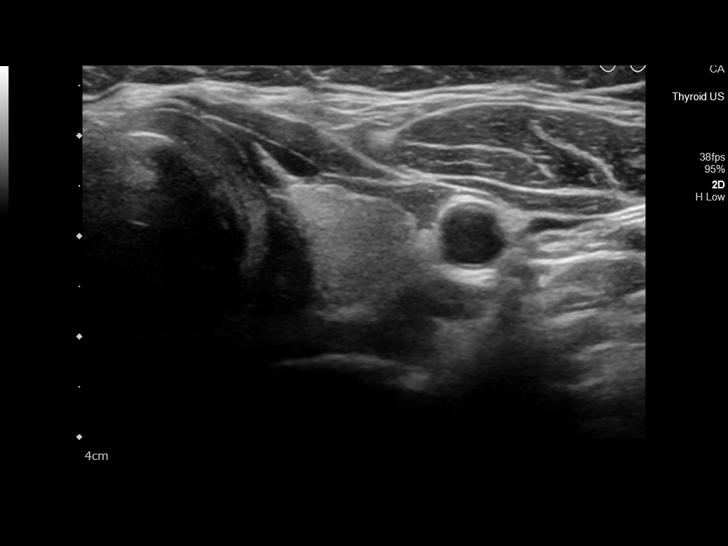
[im 21/36]
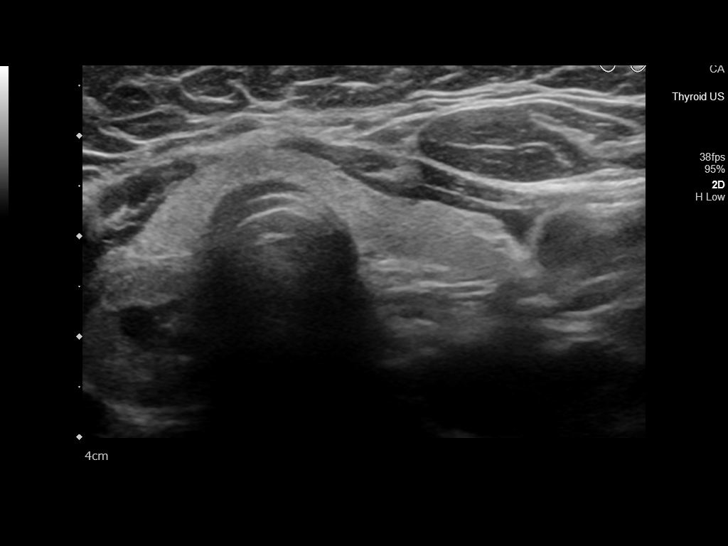
[im 24/36]
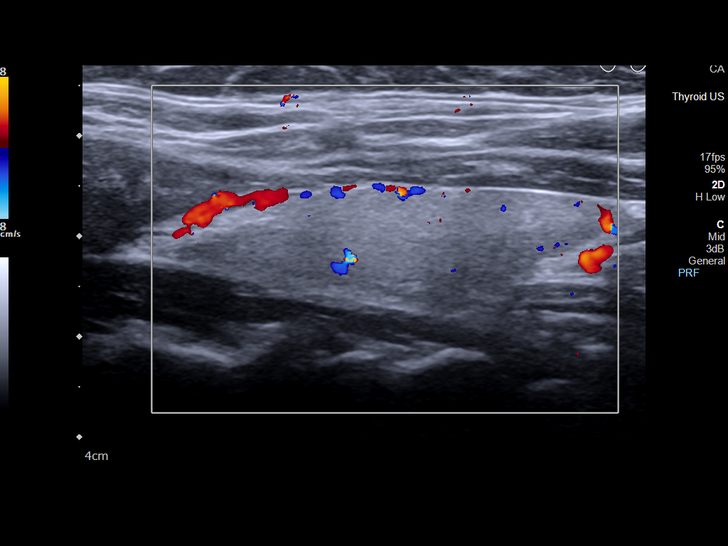
[im 27/36]
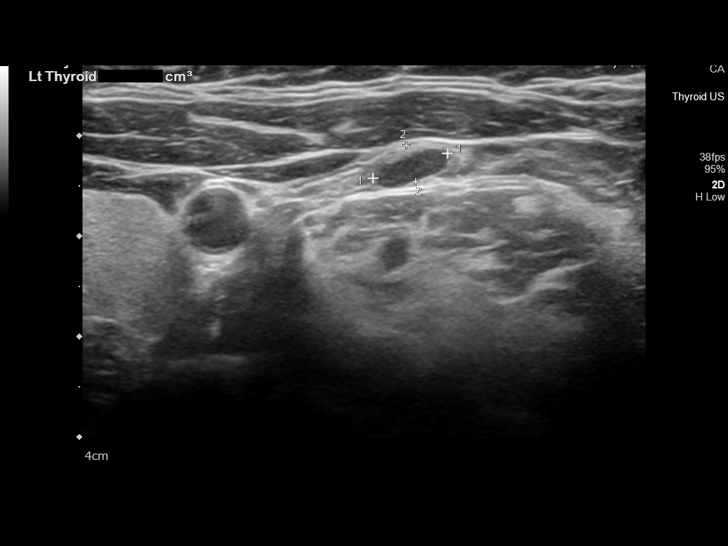
[im 30/36]
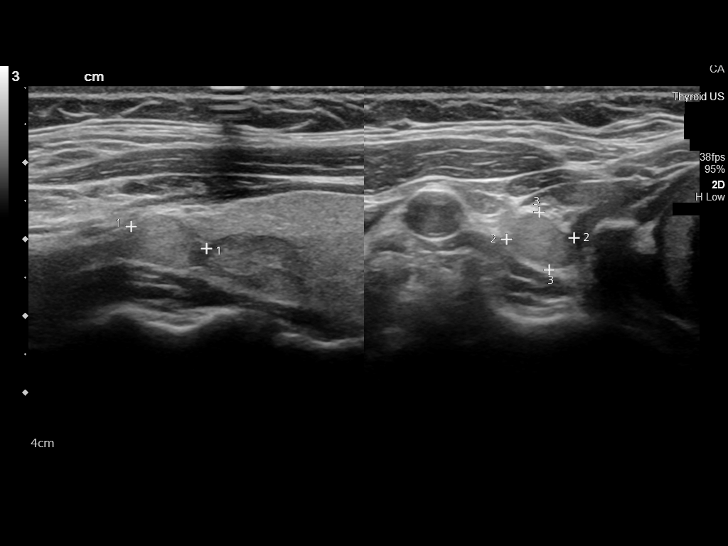
[im 33/36]
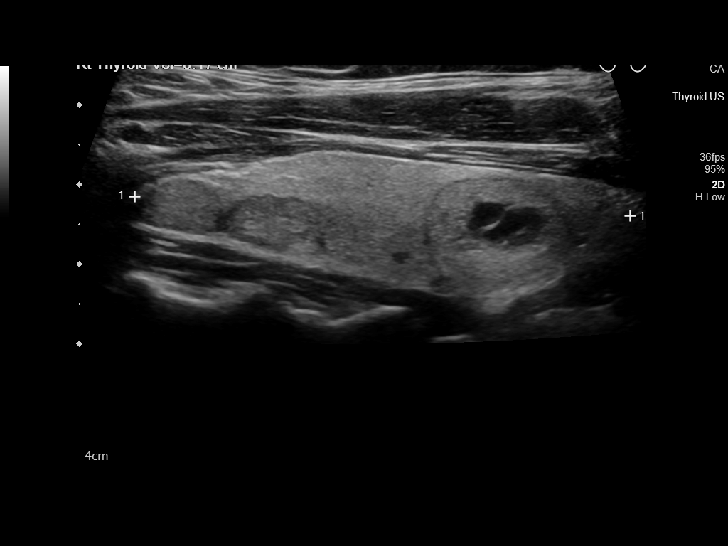
[im 36/36]
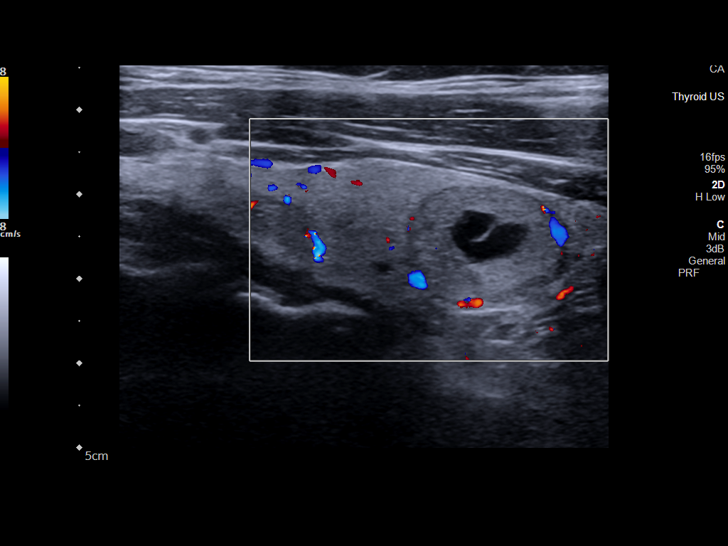

[13 of 25 positions shown; findings below may reference images not displayed]

FINDINGS: Parenchymal Echotexture: Normal

Isthmus: 0.3 cm

Right lobe: 6.2 x 1.4 x 2.0 cm

Left lobe: 4.5 x 1.3 x 1.6 cm

_________________________________________________________

Estimated total number of nodules >/= 1 cm: 3

Number of spongiform nodules >/=  2 cm not described below (TR1): 0

Number of mixed cystic and solid nodules >/= 1.5 cm not described
below (TR2): 0

_________________________________________________________

Nodule # 1:

Location: Right; Superior

Maximum size: 1.0 cm; Other 2 dimensions: 0.8 x 0.9 cm

Composition: solid/almost completely solid (2)

Echogenicity: hypoechoic (2); slightly

Shape: not taller-than-wide (0)

Margins: smooth (0)

Echogenic foci: none (0)

ACR TI-RADS total points: 4.

ACR TI-RADS risk category: TR4 (4-6 points).

ACR TI-RADS recommendations:

*Given size (>/= 1 - 1.4 cm) and appearance, a follow-up ultrasound
in 1 year should be considered based on TI-RADS criteria.

_________________________________________________________

Nodule # 2:

Location: Right; Superior

Maximum size: 1.4 cm; Other 2 dimensions: 0.9 x 1.4 cm

Composition: solid/almost completely solid (2)

Echogenicity: hypoechoic (2); slightly

Shape: not taller-than-wide (0)

Margins: ill-defined (0)

Echogenic foci: none (0)

ACR TI-RADS total points: 4.

ACR TI-RADS risk category: TR4 (4-6 points).

ACR TI-RADS recommendations:

*Given size (>/= 1 - 1.4 cm) and appearance, a follow-up ultrasound
in 1 year should be considered based on TI-RADS criteria.

_________________________________________________________

Nodule # 3:

Location: Right; Inferior

Maximum size: 1.9 cm; Other 2 dimensions: 1.5 x 1.7 cm

Composition: solid/almost completely solid (2)

Echogenicity: isoechoic (1)

Shape: not taller-than-wide (0)

Margins: smooth (0)

Echogenic foci: none (0)

ACR TI-RADS total points: 3.

ACR TI-RADS risk category: TR3 (3 points).

ACR TI-RADS recommendations:

*Given size (>/= 1.5 - 2.4 cm) and appearance, a follow-up
ultrasound in 1 year should be considered based on TI-RADS criteria.

_________________________________________________________

No discrete left thyroid nodules.

Small lymph nodes on both sides of the neck.
IMPRESSION: Right thyroid nodules, largest measures 1.9 cm. These right thyroid
nodules meet criteria for 1 year follow-up.

The above is in keeping with the ACR TI-RADS recommendations - [HOSPITAL] 9313;[DATE].

## 2023-02-17 ENCOUNTER — Other Ambulatory Visit: Payer: Self-pay | Admitting: Obstetrics and Gynecology

## 2023-02-17 DIAGNOSIS — E282 Polycystic ovarian syndrome: Secondary | ICD-10-CM

## 2023-02-17 DIAGNOSIS — Z3041 Encounter for surveillance of contraceptive pills: Secondary | ICD-10-CM

## 2023-03-12 NOTE — Progress Notes (Unsigned)
PCP:  Erasmo Downer, MD   No chief complaint on file.    HPI:      Ms. ANABRENDA FELMLEE is a 42 y.o. No obstetric history on file. who LMP was No LMP recorded. (Menstrual status: Oral contraceptives)., presents today for her annual examination.  Her menses are Q3 months with cont dosing OCPs, lasting 3-5 days, used to be 4-7 days. Dysmenorrhea mild, occurring first 1-2 days of flow. She occas has very light intermenstrual bleeding. Had 2 wks light spotting after LMP on new pack of pills, unusual for pt. Bleeding has since stopped. She is on OCPs for PCOS. Also was followed by endocrine and takes metformin and spironolactone for PCOS. Needs spironolactone RF. PCP does metformin.  Sex activity: never sexually active.  Last Pap: 09/15/18  Results were: no abnormalities /neg HPV DNA  Hx of STDs: none  There is no FH of breast cancer. There is no FH of ovarian cancer. The patient does not do self-breast exams.   Tobacco use: The patient denies current or previous tobacco use. Alcohol use: social No drug use.  Exercise: not active  She does get adequate calcium but not Vitamin D in her diet. Labs with PCP  Past Medical History:  Diagnosis Date   Dysplastic nevus 08/29/2021   left ear sup helix, mod   Dysplastic nevus 08/29/2021   mid back spinal-mod to severe, Excised 10/16/21   Dysplastic nevus 09/04/2022   LUQA, mild   Hypertension    Hypertensive urgency 06/11/2021   Polycystic ovaries     Past Surgical History:  Procedure Laterality Date   SKIN CANCER EXCISION     Non Cancerous    Family History  Problem Relation Age of Onset   Hyperlipidemia Mother    Hyperlipidemia Father    Hyperlipidemia Maternal Grandmother    Hyperlipidemia Maternal Grandfather    Hyperlipidemia Paternal Grandmother    Lymphoma Paternal Grandmother    Hyperlipidemia Paternal Grandfather    Lung cancer Paternal Grandfather    Ovarian cancer Cousin        3rd cousin    Social  History   Socioeconomic History   Marital status: Single    Spouse name: Not on file   Number of children: Not on file   Years of education: Not on file   Highest education level: Not on file  Occupational History   Not on file  Tobacco Use   Smoking status: Never   Smokeless tobacco: Never  Vaping Use   Vaping status: Former  Substance and Sexual Activity   Alcohol use: Yes    Comment: 1-2 glasses of wine per week   Drug use: No   Sexual activity: Never    Birth control/protection: Pill  Other Topics Concern   Not on file  Social History Narrative   Not on file   Social Determinants of Health   Financial Resource Strain: Not on file  Food Insecurity: Not on file  Transportation Needs: Not on file  Physical Activity: Not on file  Stress: Not on file  Social Connections: Not on file  Intimate Partner Violence: Not on file    No outpatient medications have been marked as taking for the 03/13/23 encounter (Appointment) with Kanyon Seibold, Ilona Sorrel, PA-C.     ROS:  Review of Systems  Constitutional:  Negative for fatigue, fever and unexpected weight change.  Respiratory:  Negative for cough, shortness of breath and wheezing.   Cardiovascular:  Negative for chest pain,  palpitations and leg swelling.  Gastrointestinal:  Negative for blood in stool, constipation, diarrhea, nausea and vomiting.  Endocrine: Negative for cold intolerance, heat intolerance and polyuria.  Genitourinary:  Negative for dyspareunia, dysuria, flank pain, frequency, genital sores, hematuria, menstrual problem, pelvic pain, urgency, vaginal bleeding, vaginal discharge and vaginal pain.  Musculoskeletal:  Negative for back pain, joint swelling and myalgias.  Skin:  Negative for rash.  Neurological:  Negative for dizziness, syncope, light-headedness, numbness and headaches.  Hematological:  Negative for adenopathy.  Psychiatric/Behavioral:  Negative for agitation, confusion, sleep disturbance and suicidal  ideas. The patient is not nervous/anxious.     Objective: There were no vitals taken for this visit.   Physical Exam Constitutional:      Appearance: She is well-developed.  Genitourinary:     Vulva normal.     Right Labia: No rash, tenderness or lesions.    Left Labia: No tenderness, lesions or rash.    No vaginal discharge, erythema or tenderness.      Right Adnexa: not tender and no mass present.    Left Adnexa: not tender and no mass present.    No cervical friability or polyp.     Uterus is not enlarged or tender.  Breasts:    Right: No mass, nipple discharge, skin change or tenderness.     Left: No mass, nipple discharge, skin change or tenderness.  Neck:     Thyroid: No thyromegaly.  Cardiovascular:     Rate and Rhythm: Normal rate and regular rhythm.     Heart sounds: Normal heart sounds. No murmur heard. Pulmonary:     Effort: Pulmonary effort is normal.     Breath sounds: Normal breath sounds.  Abdominal:     Palpations: Abdomen is soft.     Tenderness: There is no abdominal tenderness. There is no guarding or rebound.  Musculoskeletal:        General: Normal range of motion.     Cervical back: Normal range of motion.  Lymphadenopathy:     Cervical: No cervical adenopathy.  Neurological:     General: No focal deficit present.     Mental Status: She is alert and oriented to person, place, and time.     Cranial Nerves: No cranial nerve deficit.  Skin:    General: Skin is warm and dry.  Psychiatric:        Mood and Affect: Mood normal.        Behavior: Behavior normal.        Thought Content: Thought content normal.        Judgment: Judgment normal.  Vitals reviewed.     Assessment/Plan: Encounter for annual routine gynecological examination  Encounter for surveillance of contraceptive pills - Plan: drospirenone-ethinyl estradiol (OCELLA) 3-0.03 MG tablet; OCP RF  PCOS (polycystic ovarian syndrome) - Plan: spironolactone (ALDACTONE) 100 MG tablet,  drospirenone-ethinyl estradiol (OCELLA) 3-0.03 MG tablet; Rx RF spironolactone and OCPs. Does metformin with PCP   No orders of the defined types were placed in this encounter.            GYN counsel adequate intake of calcium and vitamin D, diet and exercise     F/U  No follow-ups on file.  Veron Senner B. Brittnie Lewey, PA-C 03/12/2023 8:14 PM

## 2023-03-13 ENCOUNTER — Encounter: Payer: Self-pay | Admitting: Obstetrics and Gynecology

## 2023-03-13 ENCOUNTER — Ambulatory Visit: Payer: BC Managed Care – PPO | Admitting: Obstetrics and Gynecology

## 2023-03-13 ENCOUNTER — Other Ambulatory Visit (HOSPITAL_COMMUNITY)
Admission: RE | Admit: 2023-03-13 | Discharge: 2023-03-13 | Disposition: A | Discharge status: Home / Self Care | Payer: BC Managed Care – PPO | Source: Ambulatory Visit | Attending: Obstetrics and Gynecology | Admitting: Obstetrics and Gynecology

## 2023-03-13 VITALS — BP 114/80 | Ht 66.0 in | Wt 226.0 lb

## 2023-03-13 DIAGNOSIS — Z3041 Encounter for surveillance of contraceptive pills: Secondary | ICD-10-CM

## 2023-03-13 DIAGNOSIS — Z01419 Encounter for gynecological examination (general) (routine) without abnormal findings: Secondary | ICD-10-CM

## 2023-03-13 DIAGNOSIS — Z124 Encounter for screening for malignant neoplasm of cervix: Secondary | ICD-10-CM

## 2023-03-13 DIAGNOSIS — Z1231 Encounter for screening mammogram for malignant neoplasm of breast: Secondary | ICD-10-CM

## 2023-03-13 DIAGNOSIS — Z1151 Encounter for screening for human papillomavirus (HPV): Secondary | ICD-10-CM | POA: Diagnosis present

## 2023-03-13 DIAGNOSIS — E282 Polycystic ovarian syndrome: Secondary | ICD-10-CM

## 2023-03-13 MED ORDER — DROSPIRENONE-ETHINYL ESTRADIOL 3-0.03 MG PO TABS
1.0000 | ORAL_TABLET | Freq: Every day | ORAL | 4 refills | Status: DC
Start: 2023-03-13 — End: 2024-05-25

## 2023-03-13 NOTE — Patient Instructions (Signed)
I value your feedback and you entrusting us with your care. If you get a [facility] patient survey, I would appreciate you taking the time to let us know about your experience today. Thank you!  [facility] ([place])--[phone]  

## 2023-03-17 LAB — CYTOLOGY - PAP
Comment: NEGATIVE
Diagnosis: NEGATIVE
High risk HPV: NEGATIVE

## 2023-04-01 ENCOUNTER — Ambulatory Visit: Payer: BC Managed Care – PPO | Admitting: Family Medicine

## 2023-04-01 ENCOUNTER — Encounter: Payer: Self-pay | Admitting: Family Medicine

## 2023-04-01 VITALS — BP 109/80 | HR 94 | Temp 97.9°F | Resp 12 | Ht 66.0 in | Wt 223.0 lb

## 2023-04-01 DIAGNOSIS — I10 Essential (primary) hypertension: Secondary | ICD-10-CM | POA: Diagnosis not present

## 2023-04-01 DIAGNOSIS — R000 Tachycardia, unspecified: Secondary | ICD-10-CM

## 2023-04-01 DIAGNOSIS — E282 Polycystic ovarian syndrome: Secondary | ICD-10-CM | POA: Diagnosis not present

## 2023-04-01 DIAGNOSIS — K7581 Nonalcoholic steatohepatitis (NASH): Secondary | ICD-10-CM | POA: Diagnosis not present

## 2023-04-01 DIAGNOSIS — E041 Nontoxic single thyroid nodule: Secondary | ICD-10-CM | POA: Diagnosis not present

## 2023-04-01 DIAGNOSIS — R7303 Prediabetes: Secondary | ICD-10-CM | POA: Insufficient documentation

## 2023-04-01 NOTE — Assessment & Plan Note (Addendum)
Chronic  CTM Recheck LFTs in 6 months

## 2023-04-01 NOTE — Assessment & Plan Note (Addendum)
Discussed use of GLPs with endocrinologist at last visit Endocrinologist prescribed Zepbound 2.5 mg weekly  Continue Zepbound

## 2023-04-01 NOTE — Progress Notes (Signed)
Established Patient Office Visit  Subjective   Patient ID: Elizabeth Hurst, female    DOB: [birth year]  Age: 42 y.o. MRN: [id number]  Chief Complaint  Patient presents with   Medical Management of Chronic Issues   [name] is here for medical management of her chronic conditions. Overall she is doing well.  Since her last visit, she was started on Zebound 2.5 mg weekly. She thinks this is going extremely well. She reports minimal side effects. She has lost 12 pounds since its initiation. Her BP has been stable at home and she denies symptoms of elevated or low BP. She is very happy with her BP in office. She has no other questions or concerns at this time.   Patient Active Problem List   Diagnosis Date Noted   Prediabetes 04/01/2023   GAD (generalized anxiety disorder) 09/13/2021   Syncope 07/14/2021   Postural dizziness with presyncope 06/11/2021   Tachycardia with heart rate 121-140 beats per minute 06/11/2021   Thyroid nodule greater than or equal to 1 cm in diameter incidentally noted on imaging study 06/11/2021   Essential hypertension 04/27/2020   Obesity 04/21/2019   Allergic rhinitis, seasonal 12/16/2017   NASH (nonalcoholic steatohepatitis) 06/09/2015   Acne 06/09/2015   PCOS (polycystic ovarian syndrome) 07/06/2014   Review of Systems  All other systems reviewed and are negative.    Objective:     BP 109/80 (BP Location: Left Arm, Patient Position: Sitting, Cuff Size: Large)   Pulse 94   Temp 97.9 F (36.6 C) (Temporal)   Resp 12   Ht 5\' 6"  (1.676 m)   Wt 223 lb (101.2 kg)   LMP 01/20/2023 (Approximate)   BMI 35.99 kg/m  BP Readings from Last 3 Encounters:  03/13/23 114/80  09/19/22 130/76  12/31/21 116/88   Wt Readings from Last 3 Encounters:  03/13/23 226 lb (102.5 kg)  09/19/22 228 lb (103.4 kg)  12/31/21 234 lb 3.2 oz (106.2 kg)   Physical Exam Constitutional:      Appearance: Normal appearance.  Cardiovascular:     Rate and Rhythm: Normal  rate and regular rhythm.     Heart sounds: Normal heart sounds.  Pulmonary:     Effort: Pulmonary effort is normal.     Breath sounds: Normal breath sounds.  Neurological:     General: No focal deficit present.     Mental Status: She is alert and oriented to person, place, and time.    No results found for any visits on 04/01/23.  Last metabolic panel Lab Results  Component Value Date   GLUCOSE 91 09/19/2022   NA 137 09/19/2022   K 4.5 09/19/2022   CL 97 09/19/2022   CO2 23 09/19/2022   BUN 13 09/19/2022   CREATININE 0.83 09/19/2022   EGFR 91 09/19/2022   CALCIUM 9.8 09/19/2022   PROT 6.8 09/19/2022   ALBUMIN 4.1 09/19/2022   LABGLOB 2.7 09/19/2022   AGRATIO 1.5 09/19/2022   BILITOT <0.2 09/19/2022   ALKPHOS 41 (L) 09/19/2022   AST 18 09/19/2022   ALT 15 09/19/2022   ANIONGAP 6 06/12/2021   Last lipids Lab Results  Component Value Date   CHOL 192 09/19/2022   HDL 68 09/19/2022   LDLCALC 105 (H) 09/19/2022   TRIG 110 09/19/2022   CHOLHDL 2.8 09/19/2022   Last hemoglobin A1c Lab Results  Component Value Date   HGBA1C 5.7 (H) 09/19/2022   Last thyroid functions Lab Results  Component Value Date   TSH  2.070 09/19/2022   T4TOTAL 10.7 06/12/2021    The ASCVD Risk score (Arnett DK, et al., 2019) failed to calculate for the following reasons:   The patient has a prior MI or stroke diagnosis    Assessment & Plan:   Problem List Items Addressed This Visit       Cardiovascular and Mediastinum   Essential hypertension - Primary    Well controlled In office BP was nml at 109 / 80  CMP was nml on 7/18 (endocrinology visit) Continue current meds        Digestive   NASH (nonalcoholic steatohepatitis)    Chronic  CTM Recheck LFTs in 6 months         Endocrine   PCOS (polycystic ovarian syndrome)    Discussed use of GLPs with endocrinologist at last visit Endocrinologist prescribed Zepbound 2.5 mg weekly  Continue Zepbound       Thyroid nodule  greater than or equal to 1 cm in diameter incidentally noted on imaging study    Followed by endocrinology          Other   Tachycardia with heart rate 121-140 beats per minute    CTM Continue metoprolol      Prediabetes    A1c 6 months ago was 5.7  Continue lifestyle modifications  A1c was nml on 7/18 (endocrinology labs)       Return in about 6 months (around 09/29/2023) for CPE.    Rometta Emery, Medical Student   Patient seen along with MS3 student Jodi Marble. I personally evaluated this patient along with the student, and verified all aspects of the history, physical exam, and medical decision making as documented by the student. I agree with the student's documentation and have made all necessary edits.  Maudy Yonan, Marzella Schlein, MD, MPH [facility]

## 2023-04-01 NOTE — Assessment & Plan Note (Addendum)
A1c 6 months ago was 5.7  Continue lifestyle modifications  A1c was nml on 7/18 (endocrinology labs)

## 2023-04-01 NOTE — Assessment & Plan Note (Addendum)
Well controlled In office BP was nml at 109 / 80  CMP was nml on 7/18 (endocrinology visit) Continue current meds

## 2023-04-01 NOTE — Assessment & Plan Note (Addendum)
Followed by endocrinology 

## 2023-04-01 NOTE — Assessment & Plan Note (Signed)
CTM Continue metoprolol

## 2023-09-11 ENCOUNTER — Ambulatory Visit: Payer: BC Managed Care – PPO | Admitting: Dermatology

## 2023-09-25 ENCOUNTER — Ambulatory Visit (INDEPENDENT_AMBULATORY_CARE_PROVIDER_SITE_OTHER): Payer: 59 | Admitting: Family Medicine

## 2023-09-25 ENCOUNTER — Encounter: Payer: Self-pay | Admitting: Family Medicine

## 2023-09-25 VITALS — BP 122/80 | HR 86 | Resp 8 | Ht 66.0 in | Wt 195.5 lb

## 2023-09-25 DIAGNOSIS — K7581 Nonalcoholic steatohepatitis (NASH): Secondary | ICD-10-CM

## 2023-09-25 DIAGNOSIS — Z0000 Encounter for general adult medical examination without abnormal findings: Secondary | ICD-10-CM

## 2023-09-25 DIAGNOSIS — Z0001 Encounter for general adult medical examination with abnormal findings: Secondary | ICD-10-CM | POA: Diagnosis not present

## 2023-09-25 DIAGNOSIS — I10 Essential (primary) hypertension: Secondary | ICD-10-CM

## 2023-09-25 DIAGNOSIS — R7303 Prediabetes: Secondary | ICD-10-CM

## 2023-09-25 DIAGNOSIS — E66811 Obesity, class 1: Secondary | ICD-10-CM

## 2023-09-25 DIAGNOSIS — Z1231 Encounter for screening mammogram for malignant neoplasm of breast: Secondary | ICD-10-CM

## 2023-09-25 DIAGNOSIS — Z6831 Body mass index (BMI) 31.0-31.9, adult: Secondary | ICD-10-CM

## 2023-09-25 NOTE — Progress Notes (Signed)
 Complete physical exam   Patient: Elizabeth Hurst   DOB: April 05, 1981   43 y.o. Female  MRN: [id number] Visit Date: 09/25/2023  Today's healthcare provider: Shirlee Latch, MD   Chief Complaint  Patient presents with   Annual Exam    Last completed 09/19/22 Diet -  High protein, adding in more fiber and low sodium consuming 3 meals daily Exercise - 2 to 3 times a week for one hour consisting of weight and walking Feeling -  well Sleeping - well Concerns - none    Subjective    BREASIA KARGES is a 43 y.o. female who presents today for a complete physical exam.    Discussed the use of AI scribe software for clinical note transcription with the patient, who gave verbal consent to proceed.  History of Present Illness   The patient, with a history of prediabetes and high blood pressure, presents for a routine physical and wellness check-up. The patient reports no new concerns and overall feels good. The patient is on metformin for prediabetes, spironolactone and metoprolol for high blood pressure, and a weight loss medication. The patient's blood pressure was initially high at the beginning of the visit but normalized towards the end. The patient has not had any mammograms yet and is due for one.        Last depression screening scores    09/19/2022    3:59 PM 12/31/2021    3:53 PM 09/13/2021    3:40 PM  PHQ 2/9 Scores  PHQ - 2 Score 0 0 0  PHQ- 9 Score 0  0   Last fall risk screening    09/19/2022    3:59 PM  Fall Risk   Falls in the past year? 0  Number falls in past yr: 0  Injury with Fall? 0  Risk for fall due to : No Fall Risks  Follow up Falls evaluation completed        Medications: Outpatient Medications Prior to Visit  Medication Sig   Dapsone 7.5 % GEL Apply 1 application  topically in the morning. (Patient taking differently: Apply 1 application  topically in the morning. Taking as needed)   doxycycline (VIBRA-TABS) 100 MG tablet Take one tab po  QD with food. (Patient taking differently: Take one tab po QD with food. Taking as needed)   drospirenone-ethinyl estradiol (SYEDA) 3-0.03 MG tablet Take 1 tablet by mouth daily. CONT DOSING Q3 months   fexofenadine (ALLEGRA) 180 MG tablet Take 180 mg by mouth as needed for allergies.   metFORMIN (GLUCOPHAGE-XR) 500 MG 24 hr tablet Take 2 tablets by mouth 2 (two) times daily.   metoprolol tartrate (LOPRESSOR) 25 MG tablet Take 1 tablet (25 mg total) by mouth 2 (two) times daily.   spironolactone (ALDACTONE) 100 MG tablet Take 1 tablet (100 mg total) by mouth daily.   tretinoin (RETIN-A) 0.05 % cream Apply a pea sized amount to the entire face QHS.   ZEPBOUND 5 MG/0.5ML Pen Inject 5 mg into the skin once a week.   [DISCONTINUED] ZEPBOUND 2.5 MG/0.5ML Pen SMARTSIG:0.5 Milliliter(s) SUB-Q Once a Week (Patient not taking: Reported on 09/25/2023)   No facility-administered medications prior to visit.    Review of Systems    Objective    BP 122/80 (BP Location: Right Arm, Patient Position: Sitting, Cuff Size: Normal)   Pulse 86   Resp (!) 8   Ht 5\' 6"  (1.676 m)   Wt 195 lb 8 oz (88.7 kg)  BMI 31.55 kg/m    Physical Exam Vitals reviewed.  Constitutional:      General: She is not in acute distress.    Appearance: Normal appearance. She is well-developed. She is not diaphoretic.  HENT:     Head: Normocephalic and atraumatic.     Right Ear: Tympanic membrane, ear canal and external ear normal.     Left Ear: Tympanic membrane, ear canal and external ear normal.     Nose: Nose normal.     Mouth/Throat:     Mouth: Mucous membranes are moist.     Pharynx: Oropharynx is clear. No oropharyngeal exudate.  Eyes:     General: No scleral icterus.    Conjunctiva/sclera: Conjunctivae normal.     Pupils: Pupils are equal, round, and reactive to light.  Neck:     Thyroid: No thyromegaly.  Cardiovascular:     Rate and Rhythm: Normal rate and regular rhythm.     Heart sounds: Normal heart  sounds. No murmur heard. Pulmonary:     Effort: Pulmonary effort is normal. No respiratory distress.     Breath sounds: Normal breath sounds. No wheezing or rales.  Abdominal:     General: There is no distension.     Palpations: Abdomen is soft.     Tenderness: There is no abdominal tenderness.  Musculoskeletal:        General: No deformity.     Cervical back: Neck supple.     Right lower leg: No edema.     Left lower leg: No edema.  Lymphadenopathy:     Cervical: No cervical adenopathy.  Skin:    General: Skin is warm and dry.     Findings: No rash.  Neurological:     Mental Status: She is alert and oriented to person, place, and time. Mental status is at baseline.     Gait: Gait normal.  Psychiatric:        Mood and Affect: Mood normal.        Behavior: Behavior normal.        Thought Content: Thought content normal.      No results found for any visits on 09/25/23.  Assessment & Plan    Routine Health Maintenance and Physical Exam  Exercise Activities and Dietary recommendations  Goals   None     Immunization History  Administered Date(s) Administered   Hepatitis A, Adult 03/10/2013, 09/14/2013   Influenza Inj Mdck Quad Pf 06/12/2017, 05/20/2018   Influenza,inj,Quad PF,6+ Mos 06/15/2016, 04/23/2019, 04/27/2020   Influenza-Unspecified 04/06/2021, 05/24/2022   Moderna Covid Bivalent Peds Booster(51mo Thru 52yrs) 04/06/2021   Moderna Sars-Covid-2 Vaccination 09/23/2019, 10/21/2019, 05/28/2020   Pfizer(Comirnaty)Fall Seasonal Vaccine 12 years and older 05/24/2022   Tdap 12/02/2005    Health Maintenance  Topic Date Due   COVID-19 Vaccine (6 - 2024-25 season) 03/30/2023   INFLUENZA VACCINE  10/27/2023 (Originally 02/27/2023)   Cervical Cancer Screening (HPV/Pap Cotest)  03/12/2028   Hepatitis C Screening  Completed   HIV Screening  Completed   HPV VACCINES  Aged Out   DTaP/Tdap/Td  Discontinued    Discussed health benefits of physical activity, and encouraged  her to engage in regular exercise appropriate for her age and condition.  Problem List Items Addressed This Visit       Cardiovascular and Mediastinum   Essential hypertension   Hypertension is well-managed with spironolactone 100 mg daily and metoprolol 25 mg twice daily. Blood pressure recorded at 122/80 mmHg after settling in, within target range. Initial  high readings can be due to not being settled in. - Recheck blood pressure at the end of the visit - Schedule six-month follow-up for blood pressure      Relevant Orders   Comprehensive metabolic panel   Lipid panel     Digestive   NASH (nonalcoholic steatohepatitis)   Recheck LFTs      Relevant Orders   Comprehensive metabolic panel     Other   Obesity   Discussed importance of healthy weight management Discussed diet and exercise  Congratulated on weight loss      Relevant Medications   ZEPBOUND 5 MG/0.5ML Pen   Other Relevant Orders   Lipid panel   Prediabetes   Prediabetes managed with metformin. Under endocr[facility]ologist care at [facility]. Recent labs, including A1c, will be shared with the endocrinologist to avoid redundant testing. Fasting not required for A1c or cholesterol tests, but may be needed for triglycerides. - Continue metformin - Perform A1c test - Share lab results with endocrinologist      Relevant Orders   Hemoglobin A1c   Other Visit Diagnoses       Encounter for annual physical exam    -  Primary   Relevant Orders   Hemoglobin A1c   Comprehensive metabolic panel   Lipid panel     Breast cancer screening by mammogram       Relevant Orders   MM 3D SCREENING MAMMOGRAM BILATERAL BREAST           General Health Maintenance Routine health maintenance up to date. Due for a mammogram. Initial mammograms may require additional imaging due to lack of comparison and dense breast tissue. Labs for cholesterol, kidney, and liver function are also due. - Order mammogram - Provide phone  number for scheduling mammogram - Perform labs for cholesterol, kidney, and liver function  Follow-up - Schedule six-month follow-up for blood pressure - Provide lab slip and phone number for mammogram scheduling.        Return in about 6 months (around 03/24/2024) for chronic disease f/u.     Shirlee Latch, MD  [facility] [phone] (phone) [phone] (fax)  [facility]

## 2023-09-25 NOTE — Patient Instructions (Signed)
 Call [facility] to schedule a mammogram [phone]

## 2023-09-25 NOTE — Assessment & Plan Note (Signed)
 Hypertension is well-managed with spironolactone 100 mg daily and metoprolol 25 mg twice daily. Blood pressure recorded at 122/80 mmHg after settling in, within target range. Initial high readings can be due to not being settled in. - Recheck blood pressure at the end of the visit - Schedule six-month follow-up for blood pressure

## 2023-09-25 NOTE — Assessment & Plan Note (Signed)
 Recheck LFTs

## 2023-09-25 NOTE — Assessment & Plan Note (Signed)
Discussed importance of healthy weight management ?Discussed diet and exercise  ?Congratulated on weight loss ?

## 2023-09-25 NOTE — Assessment & Plan Note (Signed)
 Prediabetes managed with metformin. Under endocrinologist care at [facility]. Recent labs, including A1c, will be shared with the endocrinologist to avoid redundant testing. Fasting not required for A1c or cholesterol tests, but may be needed for triglycerides. - Continue metformin - Perform A1c test - Share lab results with endocrinologist

## 2023-09-26 ENCOUNTER — Encounter: Payer: Self-pay | Admitting: Family Medicine

## 2023-09-26 LAB — COMPREHENSIVE METABOLIC PANEL
ALT: 28 [IU]/L (ref 0–32)
AST: 28 [IU]/L (ref 0–40)
Albumin: 4.2 g/dL (ref 3.9–4.9)
Alkaline Phosphatase: 37 [IU]/L — ABNORMAL LOW (ref 44–121)
BUN/Creatinine Ratio: 15 (ref 9–23)
BUN: 14 mg/dL (ref 6–24)
Bilirubin Total: <0.2 mg/dL (ref 0.0–1.2)
CO2: 20 mmol/L (ref 20–29)
Calcium: 9.7 mg/dL (ref 8.7–10.2)
Chloride: 102 mmol/L (ref 96–106)
Creatinine, Ser: 0.92 mg/dL (ref 0.57–1.00)
Globulin, Total: 2.8 g/dL (ref 1.5–4.5)
Glucose: 91 mg/dL (ref 70–99)
Potassium: 4.8 mmol/L (ref 3.5–5.2)
Sodium: 139 mmol/L (ref 134–144)
Total Protein: 7 g/dL (ref 6.0–8.5)
eGFR: 80 mL/min/{1.73_m2} (ref >59)

## 2023-09-26 LAB — HEMOGLOBIN A1C
Est. average glucose Bld gHb Est-mCnc: 108 mg/dL
Hgb A1c MFr Bld: 5.4 % (ref 4.8–5.6)

## 2023-09-26 LAB — LIPID PANEL
Chol/HDL Ratio: 2.9 {ratio} (ref 0.0–4.4)
Cholesterol, Total: 182 mg/dL (ref 100–199)
HDL: 63 mg/dL (ref >39)
LDL Chol Calc (NIH): 102 mg/dL — ABNORMAL HIGH (ref 0–99)
Triglycerides: 95 mg/dL (ref 0–149)
VLDL Cholesterol Cal: 17 mg/dL (ref 5–40)

## 2023-09-27 ENCOUNTER — Other Ambulatory Visit: Payer: Self-pay | Admitting: Family Medicine

## 2023-09-29 NOTE — Telephone Encounter (Signed)
 Requested Prescriptions  Pending Prescriptions Disp Refills   metoprolol tartrate (LOPRESSOR) 25 MG tablet [Pharmacy Med Name: METOPROLOL TARTRATE 25 MG TAB] 180 tablet 1    Sig: TAKE 1 TABLET BY MOUTH TWICE A DAY     Cardiovascular:  Beta Blockers Failed - 09/29/2023  2:18 PM      Failed - Valid encounter within last 6 months    Recent Outpatient Visits           6 months ago Essential hypertension   [place] [facility] [place], Marzella Schlein, MD   1 year ago Encounter for annual physical exam   [place] [facility] [place], Marzella Schlein, MD   1 year ago Essential hypertension   [place] [facility] [place], Marzella Schlein, MD   1 year ago Essential hypertension   [place] [facility] [place], Marzella Schlein, MD   2 years ago Encounter for annual physical exam   [place] [facility] [place], Marzella Schlein, MD       Future Appointments             In 1 week Deirdre Evener, MD [place] [facility]   In 5 months [place], Marzella Schlein, MD [facility]            Passed - Last BP in normal range    BP Readings from Last 1 Encounters:  09/25/23 122/80         Passed - Last Heart Rate in normal range    Pulse Readings from Last 1 Encounters:  09/25/23 86

## 2023-10-08 ENCOUNTER — Ambulatory Visit: Payer: BC Managed Care – PPO | Admitting: Dermatology

## 2023-10-08 ENCOUNTER — Encounter: Payer: Self-pay | Admitting: Dermatology

## 2023-10-08 DIAGNOSIS — L578 Other skin changes due to chronic exposure to nonionizing radiation: Secondary | ICD-10-CM | POA: Diagnosis not present

## 2023-10-08 DIAGNOSIS — L814 Other melanin hyperpigmentation: Secondary | ICD-10-CM | POA: Diagnosis not present

## 2023-10-08 DIAGNOSIS — L700 Acne vulgaris: Secondary | ICD-10-CM

## 2023-10-08 DIAGNOSIS — L906 Striae atrophicae: Secondary | ICD-10-CM

## 2023-10-08 DIAGNOSIS — Z1283 Encounter for screening for malignant neoplasm of skin: Secondary | ICD-10-CM

## 2023-10-08 DIAGNOSIS — D2371 Other benign neoplasm of skin of right lower limb, including hip: Secondary | ICD-10-CM

## 2023-10-08 DIAGNOSIS — D229 Melanocytic nevi, unspecified: Secondary | ICD-10-CM

## 2023-10-08 DIAGNOSIS — L720 Epidermal cyst: Secondary | ICD-10-CM

## 2023-10-08 DIAGNOSIS — D239 Other benign neoplasm of skin, unspecified: Secondary | ICD-10-CM

## 2023-10-08 DIAGNOSIS — D2262 Melanocytic nevi of left upper limb, including shoulder: Secondary | ICD-10-CM

## 2023-10-08 DIAGNOSIS — L821 Other seborrheic keratosis: Secondary | ICD-10-CM

## 2023-10-08 DIAGNOSIS — W908XXA Exposure to other nonionizing radiation, initial encounter: Secondary | ICD-10-CM

## 2023-10-08 DIAGNOSIS — E282 Polycystic ovarian syndrome: Secondary | ICD-10-CM

## 2023-10-08 DIAGNOSIS — Z7189 Other specified counseling: Secondary | ICD-10-CM

## 2023-10-08 DIAGNOSIS — Z79899 Other long term (current) drug therapy: Secondary | ICD-10-CM

## 2023-10-08 MED ORDER — DOXYCYCLINE HYCLATE 100 MG PO TABS: ORAL_TABLET | ORAL | 4 refills | Status: AC

## 2023-10-08 MED ORDER — TRETINOIN 0.05 % EX CREA: TOPICAL_CREAM | CUTANEOUS | 4 refills | Status: AC

## 2023-10-08 MED ORDER — SPIRONOLACTONE 100 MG PO TABS: 100.0000 mg | ORAL_TABLET | Freq: Every day | ORAL | 4 refills | Status: AC

## 2023-10-08 MED ORDER — DAPSONE 7.5 % EX GEL: 1.0000 | Freq: Every morning | CUTANEOUS | 4 refills | Status: AC

## 2023-10-08 NOTE — Progress Notes (Signed)
 Follow-Up Visit   Subjective  Elizabeth Hurst is a 43 y.o. female who presents for the following: Skin Cancer Screening and Full Body Skin Exam hx of Dysplastic Nevi, stretch marks on skin would like any recommendations to help, Acne Doxycycline 100mg  1 po qd prn, Spironolactone 100mg  1 po qd, Aczone 7.5% gel prn, Tretinoin 0.05% cr prn The patient presents for Total-Body Skin Exam (TBSE) for skin cancer screening and mole check. The patient has spots, moles and lesions to be evaluated, some may be new or changing and the patient may have concern these could be cancer.  The following portions of the chart were reviewed this encounter and updated as appropriate: medications, allergies, medical history  Review of Systems:  No other skin or systemic complaints except as noted in HPI or Assessment and Plan.  Objective  Well appearing patient in no apparent distress; mood and affect are within normal limits.  A full examination was performed including scalp, head, eyes, ears, nose, lips, neck, chest, axillae, abdomen, back, buttocks, bilateral upper extremities, bilateral lower extremities, hands, feet, fingers, toes, fingernails, and toenails. All findings within normal limits unless otherwise noted below.   Relevant physical exam findings are noted in the Assessment and Plan.      Assessment & Plan   SKIN CANCER SCREENING PERFORMED TODAY.  ACTINIC DAMAGE - Chronic condition, secondary to cumulative UV/sun exposure - diffuse scaly erythematous macules with underlying dyspigmentation - Recommend daily broad spectrum sunscreen SPF 30+ to sun-exposed areas, reapply every 2 hours as needed.  - Staying in the shade or wearing long sleeves, sun glasses (UVA+UVB protection) and wide brim hats (4-inch brim around the entire circumference of the hat) are also recommended for sun protection.  - Call for new or changing lesions.  LENTIGINES, SEBORRHEIC KERATOSES, HEMANGIOMAS - Benign normal  skin lesions - Benign-appearing - Call for any changes  MELANOCYTIC NEVI - Tan-brown and/or pink-flesh-colored symmetric macules and papules - Benign appearing on exam today - Observation - Call clinic for new or changing moles - Recommend daily use of broad spectrum spf 30+ sunscreen to sun-exposed areas.  - L distal lat deltoid 3.64mm dark brown regular macule see photos, observe  HISTORY OF DYSPLASTIC NEVUS No evidence of recurrence today Recommend regular full body skin exams Recommend daily broad spectrum sunscreen SPF 30+ to sun-exposed areas, reapply every 2 hours as needed.  Call if any new or changing lesions are noted between office visits  - L ear sup helix, mid back spinal, LUQA  Milia R medial canthus, R cheek Start Tretinoin 0.05% cr qhs - tiny firm white papules - type of cyst - benign - sometimes these will clear with nightly OTC adapalene/Differin 0.1% gel or retinol. - may be extracted if symptomatic - observe  Topical retinoid medications like tretinoin/Retin-A, adapalene/Differin, tazarotene/Fabior, and Epiduo/Epiduo Forte can cause dryness and irritation when first started. Only apply a pea-sized amount to the entire affected area. Avoid applying it around the eyes, edges of mouth and creases at the nose. If you experience irritation, use a good moisturizer first and/or apply the medicine less often. If you are doing well with the medicine, you can increase how often you use it until you are applying every night. Be careful with sun protection while using this medication as it can make you sensitive to the sun. This medicine should not be used by pregnant women.   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 Currently well-controlled. Treatment Plan: Cont Spironolactone 100mg  1 po qd  Cont Doxycycline 100mg  1 po qd prn flares, take with food and drink Cont Tretinoin 0.05% cr qhs  Cont Aczone  7.5% gel qam  Spironolactone can cause increased urination and cause blood pressure to decrease. Please watch for signs of lightheadedness and be cautious when changing position. It can sometimes cause breast tenderness or an irregular period in premenopausal women. It can also increase potassium. The increase in potassium usually is not a concern unless you are taking other medicines that also increase potassium, so please be sure your doctor knows all of the other medications you are taking. This medication should not be taken by pregnant women.  This medicine should also not be taken together with sulfa drugs like Bactrim (trimethoprim/sulfamethexazole).   Doxycycline should be taken with food to prevent nausea. Do not lay down for 30 minutes after taking. Be cautious with sun exposure and use good sun protection while on this medication. Pregnant women should not take this medication.   Topical retinoid medications like tretinoin/Retin-A, adapalene/Differin, tazarotene/Fabior, and Epiduo/Epiduo Forte can cause dryness and irritation when first started. Only apply a pea-sized amount to the entire affected area. Avoid applying it around the eyes, edges of mouth and creases at the nose. If you experience irritation, use a good moisturizer first and/or apply the medicine less often. If you are doing well with the medicine, you can increase how often you use it until you are applying every night. Be careful with sun protection while using this medication as it can make you sensitive to the sun. This medicine should not be used by pregnant women.   Long term medication management.  Patient is using long term (months to years) prescription medication  to control their dermatologic condition.  These medications require periodic monitoring to evaluate for efficacy and side effects and may require periodic laboratory monitoring.   STRIAE abdomen Exam: striae abdomen Treatment Plan: Start Tretinoin 0.05% cr  qhs  Topical retinoid medications like tretinoin/Retin-A, adapalene/Differin, tazarotene/Fabior, and Epiduo/Epiduo Forte can cause dryness and irritation when first started. Only apply a pea-sized amount to the entire affected area. Avoid applying it around the eyes, edges of mouth and creases at the nose. If you experience irritation, use a good moisturizer first and/or apply the medicine less often. If you are doing well with the medicine, you can increase how often you use it until you are applying every night. Be careful with sun protection while using this medication as it can make you sensitive to the sun. This medicine should not be used by pregnant women.   DERMATOFIBROMA R medial knee Exam: Firm pink/brown papulenodule with dimple sign R med knee Treatment Plan: A dermatofibroma is a benign growth possibly related to trauma, such as an insect bite, cut from shaving, or inflamed acne-type bump.  Treatment options to remove include shave or excision with resulting scar and risk of recurrence.  Since benign-appearing and not bothersome, will observe for now.   Return in about 1 year (around 10/07/2024) for TBSE, Hx of Dysplastic nevi.  I, Ardis Rowan, RMA, am acting as scribe for Armida Sans, MD .   Documentation: I have reviewed the above documentation for accuracy and completeness, and I agree with the above.  Armida Sans, MD

## 2023-10-08 NOTE — Patient Instructions (Addendum)

## 2023-11-10 ENCOUNTER — Ambulatory Visit
Admission: RE | Admit: 2023-11-10 | Discharge: 2023-11-10 | Disposition: A | Discharge status: Home / Self Care | Source: Ambulatory Visit | Attending: Family Medicine | Admitting: Family Medicine

## 2023-11-10 DIAGNOSIS — Z1231 Encounter for screening mammogram for malignant neoplasm of breast: Secondary | ICD-10-CM | POA: Diagnosis present

## 2023-11-12 ENCOUNTER — Encounter: Payer: Self-pay | Admitting: Family Medicine

## 2023-11-12 ENCOUNTER — Other Ambulatory Visit: Payer: Self-pay | Admitting: Family Medicine

## 2023-11-12 DIAGNOSIS — R928 Other abnormal and inconclusive findings on diagnostic imaging of breast: Secondary | ICD-10-CM

## 2023-11-14 ENCOUNTER — Ambulatory Visit
Admission: RE | Admit: 2023-11-14 | Discharge: 2023-11-14 | Disposition: A | Discharge status: Home / Self Care | Source: Ambulatory Visit | Attending: Family Medicine | Admitting: Family Medicine

## 2023-11-14 DIAGNOSIS — R928 Other abnormal and inconclusive findings on diagnostic imaging of breast: Secondary | ICD-10-CM | POA: Diagnosis present

## 2024-02-20 LAB — HM DIABETES EYE EXAM

## 2024-02-23 ENCOUNTER — Encounter: Payer: Self-pay | Admitting: Family Medicine

## 2024-03-13 ENCOUNTER — Other Ambulatory Visit: Payer: Self-pay | Admitting: Family Medicine

## 2024-03-17 ENCOUNTER — Telehealth: Payer: Self-pay

## 2024-03-17 NOTE — Telephone Encounter (Signed)
 Received letter from [facility] suggesting patient switching to a non-oral contraceptive method or adding a barrier method contraception for at least 4 weeks after initiation of tirzepatide and for 4 weeks after each tirzepatide dose escalation. Called pt, no answer, L[facility]. Pt also due for annual.

## 2024-03-17 NOTE — Telephone Encounter (Signed)
 Pt called back and is aware. States she already knew this information, they tell her this info every time she picks up Rx from pharmacy. She has been taking/using both Rx's for over a year. States she is not sexually active currently.

## 2024-03-25 ENCOUNTER — Ambulatory Visit: Payer: 59 | Admitting: Family Medicine

## 2024-03-25 ENCOUNTER — Encounter: Payer: Self-pay | Admitting: Family Medicine

## 2024-03-25 VITALS — BP 128/88 | HR 81 | Ht 66.0 in | Wt 179.4 lb

## 2024-03-25 DIAGNOSIS — K5903 Drug induced constipation: Secondary | ICD-10-CM | POA: Insufficient documentation

## 2024-03-25 DIAGNOSIS — E282 Polycystic ovarian syndrome: Secondary | ICD-10-CM | POA: Diagnosis not present

## 2024-03-25 DIAGNOSIS — R7303 Prediabetes: Secondary | ICD-10-CM

## 2024-03-25 DIAGNOSIS — G43109 Migraine with aura, not intractable, without status migrainosus: Secondary | ICD-10-CM | POA: Insufficient documentation

## 2024-03-25 DIAGNOSIS — I10 Essential (primary) hypertension: Secondary | ICD-10-CM

## 2024-03-25 DIAGNOSIS — E66811 Obesity, class 1: Secondary | ICD-10-CM

## 2024-03-25 DIAGNOSIS — Z6831 Body mass index (BMI) 31.0-31.9, adult: Secondary | ICD-10-CM

## 2024-03-25 NOTE — Assessment & Plan Note (Signed)
 Prediabetes managed with metformin. Under endocrinologist care at [facility]

## 2024-03-25 NOTE — Assessment & Plan Note (Signed)
 Significant weight loss of 56 pounds achieved with Zepbound, dietary changes, and increased physical activity. Actively engaged in lifestyle modifications, including playing pickleball and going to the gym. - Continue Zepbound at current dose of 7.5 mg. - Maintain current lifestyle modifications including diet and exercise. - Monitor weight and adjust treatment as necessary.

## 2024-03-25 NOTE — Assessment & Plan Note (Signed)
 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 Occurred twice in January during illness and once in May before breakfast, resolving after eating. Eye examination by ophthalmologist was normal. No current need for daily prophylactic medication due to infrequency. - Consider treatment with Imitrex if symptoms become more frequent or severe.

## 2024-03-25 NOTE — Assessment & Plan Note (Signed)
 Blood pressure readings have improved with current management, though diastolic pressure remains slightly elevated at 128/88 mmHg. Weight loss and lifestyle changes are contributing positively to blood pressure control. - Maintain current lifestyle modifications including diet and exercise. - Reassess blood pressure control at next visit.

## 2024-03-25 NOTE — Progress Notes (Signed)
 Established patient visit   Patient: Elizabeth Hurst   DOB: 30-Mar-1981   43 y.o. Female  MRN: [id number] Visit Date: 03/25/2024  Today's healthcare provider: Jon Eva, MD   Chief Complaint  Patient presents with   Medical Management of Chronic Issues    Patient is present for chronic care follow-up  Ora migraines present in last year. Two where in January while sick and then the other was before breakfast in May and goes away after eating. Spoke with eye doctor about concerns at last visit and he found no concerns so advised patient to mention with pcp. Has appt with endo in Novemeber   Hypertension    Patient reports she monitors at home with reading ranging from 122-140/80-88. She does not smoke. She reports no symptoms.    Subjective    Hypertension   HPI     Medical Management of Chronic Issues    Additional comments: Patient is present for chronic care follow-up  Ora migraines present in last year. Two where in January while sick and then the other was before breakfast in May and goes away after eating. Spoke with eye doctor about concerns at last visit and he found no concerns so advised patient to mention with pcp. Has appt with endo in Novemeber        Hypertension    Additional comments: Patient reports she monitors at home with reading ranging from 122-140/80-88. She does not smoke. She reports no symptoms.       Last edited by Lilian Fitzpatrick, CMA on 03/25/2024  3:54 PM.       Discussed the use of AI scribe software for clinical note transcription with the patient, who gave verbal consent to proceed.  History of Present Illness   Elizabeth Hurst is a 43 year old female who presents with migraine aura without headache.  She experiences migraine auras without associated headaches, with two episodes in January during an illness and another in May before breakfast, resolving after eating. There is no pain with these auras, and she does not require  medications like Imitrex, as Advil and Tylenol  suffice.  Her blood pressure is slightly elevated but improving, with a reading of 122/88 mmHg in early August. She manages PCOS and prediabetes with spironolactone  and metformin. Zepbound has contributed to a weight loss of 56 pounds.  Constipation, attributed to Zepbound, worsened after increasing the dose to 7.5 mg, leading to hemorrhoids, bleeding, and pain. She uses Metamucil gummies and Colace but is considering switching to Miralax for better results, aiming for a soft, easy-to-pass bowel movement daily.         Medications: Outpatient Medications Prior to Visit  Medication Sig   Dapsone  7.5 % GEL Apply 1 application  topically in the morning. (Patient taking differently: Apply 1 application  topically in the morning. Taking as needed)   doxycycline  (VIBRA -TABS) 100 MG tablet Take one tab po QD with food. (Patient taking differently: Taking as needed)   drospirenone -ethinyl estradiol  (SYEDA ) 3-0.03 MG tablet Take 1 tablet by mouth daily. CONT DOSING Q3 months   fexofenadine (ALLEGRA) 180 MG tablet Take 180 mg by mouth as needed for allergies.   metFORMIN (GLUCOPHAGE-XR) 500 MG 24 hr tablet Take 2 tablets by mouth 2 (two) times daily.   metoprolol  tartrate (LOPRESSOR ) 25 MG tablet TAKE 1 TABLET BY MOUTH TWICE A DAY   spironolactone  (ALDACTONE ) 100 MG tablet Take 1 tablet (100 mg total) by mouth daily.   tirzepatide (  ZEPBOUND) 7.5 MG/0.5ML Pen Inject 7.5 mg into the skin once a week.   tretinoin  (RETIN-A ) 0.05 % cream Apply a pea sized amount to the entire face QHS for acne (Patient taking differently: Apply a pea sized amount to the entire face QHS for acne)   No facility-administered medications prior to visit.    Review of Systems     Objective    BP 128/88 (BP Location: Left Arm, Patient Position: Sitting, Cuff Size: Normal)   Pulse 81   Ht 5' 6 (1.676 m)   Wt 179 lb 6.4 oz (81.4 kg)   SpO2 100%   BMI 28.96 kg/m     Physical Exam Vitals reviewed.  Constitutional:      General: She is not in acute distress.    Appearance: Normal appearance. She is well-developed. She is not diaphoretic.  HENT:     Head: Normocephalic and atraumatic.  Eyes:     General: No scleral icterus.    Conjunctiva/sclera: Conjunctivae normal.  Neck:     Thyroid : No thyromegaly.  Cardiovascular:     Rate and Rhythm: Normal rate and regular rhythm.     Heart sounds: Normal heart sounds. No murmur heard. Pulmonary:     Effort: Pulmonary effort is normal. No respiratory distress.     Breath sounds: Normal breath sounds. No wheezing, rhonchi or rales.  Musculoskeletal:     Cervical back: Neck supple.     Right lower leg: No edema.     Left lower leg: No edema.  Lymphadenopathy:     Cervical: No cervical adenopathy.  Skin:    General: Skin is warm and dry.     Findings: No rash.  Neurological:     Mental Status: She is alert and oriented to person, place, and time. Mental status is at baseline.  Psychiatric:        Mood and Affect: Mood normal.        Behavior: Behavior normal.      No results found for any visits on 03/25/24.  Assessment & Plan     Problem List Items Addressed This Visit       Cardiovascular and Mediastinum   Essential hypertension   Blood pressure readings have improved with current management, though diastolic pressure remains slightly elevated at 128/88 mmHg. Weight loss and lifestyle changes are contributing positively to blood pressure control. - Maintain current lifestyle modifications including diet and exercise. - Reassess blood pressure control at next visit.      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 Occurred twice in January during illness and once in May before breakfast, resolving after eating. Eye examination by ophthalmologist was normal. No current need for daily prophylactic medication due to  infrequency. - Consider treatment with Imitrex if symptoms become more frequent or severe.        Digestive   Drug-induced constipation   Constipation likely related to Zepbound use, leading to hemorrhoids and rectal bleeding. Current management includes Metamucil gummies and Colace, with recent increase in Metamucil dosage. Reports bleeding has decreased. - Discontinue Colace and initiate Miralax, starting with one capful daily and adjust as needed for one soft bowel movement per day. - Continue Metamucil gummies, up to three per day if needed. - Use over-the-counter hemorrhoid treatments like Preparation H as needed. - Ensure adequate hydration and dietary fiber intake.        Endocrine   PCOS (polycystic ovarian syndrome)   Ongoing management  with spironolactone  and metformin. Regular follow-up with endocrinologist. Recent labs were satisfactory, with A1c scheduled for November. - Continue spironolactone  and metformin as prescribed. - Follow up with endocrinologist as scheduled. - Monitor A1c in November.        Other   Obesity   Significant weight loss of 56 pounds achieved with Zepbound, dietary changes, and increased physical activity. Actively engaged in lifestyle modifications, including playing pickleball and going to the gym. - Continue Zepbound at current dose of 7.5 mg. - Maintain current lifestyle modifications including diet and exercise. - Monitor weight and adjust treatment as necessary.      Prediabetes   Prediabetes managed with metformin. Under endocrinologist care at [facility].         Return in about 6 months (around 09/25/2024) for CPE.       Jon Eva, MD  [facility] [phone] (phone) [phone] (fax)  [facility]

## 2024-03-25 NOTE — Assessment & Plan Note (Signed)
 Ongoing management with spironolactone  and metformin. Regular follow-up with endocrinologist. Recent labs were satisfactory, with A1c scheduled for November. - Continue spironolactone  and metformin as prescribed. - Follow up with endocrinologist as scheduled. - Monitor A1c in November.

## 2024-03-25 NOTE — Assessment & Plan Note (Signed)
 Constipation likely related to Zepbound use, leading to hemorrhoids and rectal bleeding. Current management includes Metamucil gummies and Colace, with recent increase in Metamucil dosage. Reports bleeding has decreased. - Discontinue Colace and initiate Miralax, starting with one capful daily and adjust as needed for one soft bowel movement per day. - Continue Metamucil gummies, up to three per day if needed. - Use over-the-counter hemorrhoid treatments like Preparation H as needed. - Ensure adequate hydration and dietary fiber intake.

## 2024-03-29 ENCOUNTER — Encounter: Payer: Self-pay | Admitting: Family Medicine

## 2024-03-30 ENCOUNTER — Other Ambulatory Visit: Payer: Self-pay

## 2024-03-30 MED ORDER — METOPROLOL TARTRATE 25 MG PO TABS: 25.0000 mg | ORAL_TABLET | Freq: Two times a day (BID) | ORAL | 1 refills | Status: AC

## 2024-05-25 ENCOUNTER — Other Ambulatory Visit: Payer: Self-pay

## 2024-05-25 ENCOUNTER — Other Ambulatory Visit: Payer: Self-pay | Admitting: Obstetrics and Gynecology

## 2024-05-25 DIAGNOSIS — Z3041 Encounter for surveillance of contraceptive pills: Secondary | ICD-10-CM

## 2024-05-25 DIAGNOSIS — E282 Polycystic ovarian syndrome: Secondary | ICD-10-CM

## 2024-05-25 MED ORDER — DROSPIRENONE-ETHINYL ESTRADIOL 3-0.03 MG PO TABS: 1.0000 | ORAL_TABLET | Freq: Every day | ORAL | 0 refills | Status: DC

## 2024-06-28 NOTE — Progress Notes (Unsigned)
 PCP:  Myrla Jon HERO, MD   No chief complaint on file.    HPI:      [name] is a 43 y.o. No obstetric history on file. who LMP was No LMP recorded. (Menstrual status: Oral contraceptives)., presents today for her annual examination.  Her menses are Q3 months with cont dosing OCPs, lasting 5-7 days, mod flow; Dysmenorrhea mild, occurring first 1-2 days of flow. She occas has very light intermenstrual bleeding.  She is on OCPs for PCOS. Also was followed by endocrine and takes metformin and spironolactone  (Rxd by derm now) for PCOS.  Sex activity: never sexually active. No vag sx.  Last Pap: 03/13/23  Results were: no abnormalities /neg HPV DNA  Hx of STDs: none  Last mammo: 11/14/23 Results were normal after addl views LT breast, repeat in 12 months There is no FH of breast cancer. There is no FH of ovarian cancer. The patient does not do self-breast exams.   Tobacco use: The patient denies current or previous tobacco use. Alcohol use: none No drug use.  Exercise: mod active in summer  She does get adequate calcium  but not Vitamin D in her diet. Labs with PCP. Doing zepbound with wt loss  Past Medical History:  Diagnosis Date   Dysplastic nevus 08/29/2021   left ear sup helix, mod   Dysplastic nevus 08/29/2021   mid back spinal-mod to severe, Excised 10/16/21   Dysplastic nevus 09/04/2022   LUQA, mild   Hypertension    Hypertensive urgency 06/11/2021   Polycystic ovaries     Past Surgical History:  Procedure Laterality Date   SKIN CANCER EXCISION     Non Cancerous    Family History  Problem Relation Age of Onset   Hyperlipidemia Mother    Hyperlipidemia Father    Hyperlipidemia Maternal Grandmother    Hyperlipidemia Maternal Grandfather    Hyperlipidemia Paternal Grandmother    Lymphoma Paternal Grandmother    Hyperlipidemia Paternal Grandfather    Lung cancer Paternal Grandfather    Ovarian cancer Cousin        3rd cousin    Social  History   Socioeconomic History   Marital status: Single    Spouse name: Not on file   Number of children: Not on file   Years of education: Not on file   Highest education level: Master's degree (e.g., MA, MS, MEng, MEd, MSW, MBA)  Occupational History   Not on file  Tobacco Use   Smoking status: Never   Smokeless tobacco: Never  Vaping Use   Vaping status: Never Used  Substance and Sexual Activity   Alcohol use: Yes    Comment: 1-2 glasses of wine per week   Drug use: No   Sexual activity: Never    Birth control/protection: Pill  Other Topics Concern   Not on file  Social History Narrative   Not on file   Social Drivers of Health   Financial Resource Strain: Low Risk  (03/23/2024)   Overall Financial Resource Strain (CARDIA)    Difficulty of Paying Living Expenses: Not hard at all  Food Insecurity: No Food Insecurity (03/23/2024)   Hunger Vital Sign    Worried About Running Out of Food in the Last Year: Never true    Ran Out of Food in the Last Year: Never true  Transportation Needs: No Transportation Needs (03/23/2024)   PRAPARE - Administrator, Civil Service (Medical): No    Lack of Transportation (Non-Medical):  No  Physical Activity: Sufficiently Active (03/23/2024)   Exercise Vital Sign    Days of Exercise per Week: 3 days    Minutes of Exercise per Session: 60 min  Stress: No Stress Concern Present (03/23/2024)   [employer] of Occupational Health - Occupational Stress Questionnaire    Feeling of Stress: Not at all  Social Connections: Moderately Isolated (03/23/2024)   Social Connection and Isolation Panel    Frequency of Communication with Friends and Family: More than three times a week    Frequency of Social Gatherings with Friends and Family: Three times a week    Attends Religious Services: Never    Active Member of Clubs or Organizations: Yes    Attends Banker Meetings: More than 4 times per year    Marital Status: Never  married  Intimate Partner Violence: Not on file    No outpatient medications have been marked as taking for the 06/29/24 encounter (Appointment) with Elizabeth Hurst, Elizabeth NOVAK, PA-C.     ROS:  Review of Systems  Constitutional:  Negative for fatigue, fever and unexpected weight change.  Respiratory:  Negative for cough, shortness of breath and wheezing.   Cardiovascular:  Negative for chest pain, palpitations and leg swelling.  Gastrointestinal:  Negative for blood in stool, constipation, diarrhea, nausea and vomiting.  Endocrine: Negative for cold intolerance, heat intolerance and polyuria.  Genitourinary:  Negative for dyspareunia, dysuria, flank pain, frequency, genital sores, hematuria, menstrual problem, pelvic pain, urgency, vaginal bleeding, vaginal discharge and vaginal pain.  Musculoskeletal:  Negative for back pain, joint swelling and myalgias.  Skin:  Negative for rash.  Neurological:  Negative for dizziness, syncope, light-headedness, numbness and headaches.  Hematological:  Negative for adenopathy.  Psychiatric/Behavioral:  Negative for agitation, confusion, sleep disturbance and suicidal ideas. The patient is not nervous/anxious.     Objective: There were no vitals taken for this visit.   Physical Exam Constitutional:      Appearance: She is well-developed.  Genitourinary:     Vulva normal.     Right Labia: No rash, tenderness or lesions.    Left Labia: No tenderness, lesions or rash.    No vaginal discharge, erythema or tenderness.      Right Adnexa: not tender and no mass present.    Left Adnexa: not tender and no mass present.    No cervical friability or polyp.     Uterus is not enlarged or tender.  Breasts:    Right: No mass, nipple discharge, skin change or tenderness.     Left: No mass, nipple discharge, skin change or tenderness.  Neck:     Thyroid : No thyromegaly.  Cardiovascular:     Rate and Rhythm: Normal rate and regular rhythm.     Heart sounds:  Murmur heard.     Systolic murmur is present with a grade of 1/6.  Pulmonary:     Effort: Pulmonary effort is normal.     Breath sounds: Normal breath sounds.  Abdominal:     Palpations: Abdomen is soft.     Tenderness: There is no abdominal tenderness. There is no guarding or rebound.  Musculoskeletal:        General: Normal range of motion.     Cervical back: Normal range of motion.  Lymphadenopathy:     Cervical: No cervical adenopathy.  Neurological:     General: No focal deficit present.     Mental Status: She is alert and oriented to person, place, and time.  Cranial Nerves: No cranial nerve deficit.  Skin:    General: Skin is warm and dry.  Psychiatric:        Mood and Affect: Mood normal.        Behavior: Behavior normal.        Thought Content: Thought content normal.        Judgment: Judgment normal.  Vitals reviewed.     Assessment/Plan: Encounter for annual routine gynecological examination  Cervical cancer screening - Plan: Cytology - PAP  Screening for HPV (human papillomavirus) - Plan: Cytology - PAP  Encounter for surveillance of contraceptive pills - Plan: drospirenone -ethinyl estradiol  (SYEDA ) 3-0.03 MG tablet; Rx RF.   Encounter for screening mammogram for malignant neoplasm of breast - Plan: MM 3D SCREENING MAMMOGRAM BILATERAL BREAST; pt to schedule mammo  PCOS (polycystic ovarian syndrome) - Plan: drospirenone -ethinyl estradiol  (SYEDA ) 3-0.03 MG tablet; doing well; endocrine and derm managing meds. Pt doing zepboud with wt loss  Heart murmur--pt had echo 11/22 with trivial mitral and triscuspid regurgitation which can correspond with murmur on exam.    No orders of the defined types were placed in this encounter.            GYN counsel adequate intake of calcium  and vitamin D, diet and exercise     F/U  No follow-ups on file.  Toron Bowring B. Roald Lukacs, PA-C 06/28/2024 8:34 PM

## 2024-06-29 ENCOUNTER — Encounter: Payer: Self-pay | Admitting: Obstetrics and Gynecology

## 2024-06-29 ENCOUNTER — Ambulatory Visit: Admitting: Obstetrics and Gynecology

## 2024-06-29 VITALS — BP 134/88 | HR 85 | Ht 66.0 in | Wt 170.0 lb

## 2024-06-29 DIAGNOSIS — Z01419 Encounter for gynecological examination (general) (routine) without abnormal findings: Secondary | ICD-10-CM

## 2024-06-29 DIAGNOSIS — Z1231 Encounter for screening mammogram for malignant neoplasm of breast: Secondary | ICD-10-CM

## 2024-06-29 DIAGNOSIS — E282 Polycystic ovarian syndrome: Secondary | ICD-10-CM

## 2024-06-29 DIAGNOSIS — Z3041 Encounter for surveillance of contraceptive pills: Secondary | ICD-10-CM

## 2024-06-29 MED ORDER — DROSPIRENONE-ETHINYL ESTRADIOL 3-0.03 MG PO TABS: 1.0000 | ORAL_TABLET | Freq: Every day | ORAL | 4 refills | Status: AC

## 2024-06-29 NOTE — Patient Instructions (Signed)
 I value your feedback and you entrusting [place] with your care. If you get a [place] patient survey, I would appreciate you taking the time to let us know about your experience today. Thank you! ? ? ?

## 2024-08-10 ENCOUNTER — Encounter: Payer: Self-pay | Admitting: Family Medicine

## 2024-08-16 ENCOUNTER — Other Ambulatory Visit: Payer: Self-pay

## 2024-08-16 DIAGNOSIS — Z789 Other specified health status: Secondary | ICD-10-CM

## 2024-08-16 NOTE — Telephone Encounter (Signed)
 Ok to order Hep B, VZV, and MMR titers. Let patient know that there is not just a complete panel that can be checked and we have to order each titer individually.  If there are any others that she needs documentation of immunity for, please let us  know and we can order those as well.

## 2024-08-16 NOTE — Telephone Encounter (Signed)
 Copied from CRM [id number]. Topic: Clinical - Request for Lab/Test Order >> Aug 16, 2024  8:07 AM Olam RAMAN wrote: Reason for CRM: Pt is requesting a tither test

## 2024-08-17 ENCOUNTER — Ambulatory Visit: Payer: Self-pay | Admitting: Family Medicine

## 2024-08-17 LAB — VARICELLA ZOSTER ANTIBODY, IGG: Varicella zoster IgG: REACTIVE

## 2024-08-17 LAB — MEASLES/MUMPS/RUBELLA IMMUNITY
MUMPS ABS, IGG: 53.5 [AU]/ml
RUBEOLA AB, IGG: 43.2 [AU]/ml
Rubella Antibodies, IGG: 10.7 {index}

## 2024-08-17 LAB — HEPATITIS B SURFACE ANTIBODY,QUALITATIVE: Hep B Surface Ab, Qual: REACTIVE

## 2024-09-27 ENCOUNTER — Ambulatory Visit: Admitting: Family Medicine

## 2024-10-13 ENCOUNTER — Ambulatory Visit: Admitting: Dermatology
